# Patient Record
Sex: Male | Born: 1965 | Race: White | Hispanic: No | Marital: Married | Smoking: Never smoker
Health system: Southern US, Community
[De-identification: ages and names within clinical notes are randomized; demographics above are authoritative.]

## PROBLEM LIST (undated history)

## (undated) DIAGNOSIS — K219 Gastro-esophageal reflux disease without esophagitis: Secondary | ICD-10-CM

## (undated) DIAGNOSIS — R079 Chest pain, unspecified: Secondary | ICD-10-CM

## (undated) DIAGNOSIS — R030 Elevated blood-pressure reading, without diagnosis of hypertension: Secondary | ICD-10-CM

## (undated) DIAGNOSIS — Z8489 Family history of other specified conditions: Secondary | ICD-10-CM

---

## 2010-06-19 ENCOUNTER — Emergency Department (HOSPITAL_COMMUNITY)
Admission: EM | Admit: 2010-06-19 | Discharge: 2010-06-19 | Payer: Self-pay | Source: Home / Self Care | Admitting: Emergency Medicine

## 2010-08-30 LAB — POCT CARDIAC MARKERS
CKMB, poc: <1 ng/mL — ABNORMAL LOW (ref 1.0–8.0)
Troponin i, poc: <0.05 ng/mL (ref 0.00–0.09)
Troponin i, poc: <0.05 ng/mL (ref 0.00–0.09)

## 2010-08-30 LAB — CBC
HCT: 42 % (ref 39.0–52.0)
MCV: 92.3 fL (ref 78.0–100.0)
RDW: 12.5 % (ref 11.5–15.5)
WBC: 7.2 10*3/uL (ref 4.0–10.5)

## 2010-08-30 LAB — DIFFERENTIAL
Basophils Absolute: 0 10*3/uL (ref 0.0–0.1)
Eosinophils Relative: 2 % (ref 0–5)
Lymphocytes Relative: 33 % (ref 12–46)
Lymphs Abs: 2.4 10*3/uL (ref 0.7–4.0)
Monocytes Absolute: 0.5 10*3/uL (ref 0.1–1.0)

## 2010-08-30 LAB — BASIC METABOLIC PANEL
BUN: 13 mg/dL (ref 6–23)
Chloride: 105 mEq/L (ref 96–112)
Potassium: 3.4 mEq/L — ABNORMAL LOW (ref 3.5–5.1)

## 2011-04-23 ENCOUNTER — Other Ambulatory Visit: Payer: Self-pay

## 2011-04-23 ENCOUNTER — Observation Stay (HOSPITAL_COMMUNITY)
Admission: EM | Admit: 2011-04-23 | Discharge: 2011-04-24 | Disposition: A | Discharge status: Home / Self Care | Payer: 59 | Attending: Internal Medicine | Admitting: Internal Medicine

## 2011-04-23 ENCOUNTER — Emergency Department (HOSPITAL_COMMUNITY): Payer: 59

## 2011-04-23 DIAGNOSIS — R079 Chest pain, unspecified: Secondary | ICD-10-CM

## 2011-04-23 HISTORY — DX: Gastro-esophageal reflux disease without esophagitis: K21.9

## 2011-04-23 LAB — COMPREHENSIVE METABOLIC PANEL
Alkaline Phosphatase: 63 U/L (ref 39–117)
BUN: 18 mg/dL (ref 6–23)
Calcium: 9.5 mg/dL (ref 8.4–10.5)
GFR calc Af Amer: >90 mL/min (ref >90)
Glucose, Bld: 103 mg/dL — ABNORMAL HIGH (ref 70–99)
Total Protein: 7 g/dL (ref 6.0–8.3)

## 2011-04-23 LAB — CBC
HCT: 42 % (ref 39.0–52.0)
Hemoglobin: 14.9 g/dL (ref 13.0–17.0)
MCH: 32.3 pg (ref 26.0–34.0)
MCHC: 35.5 g/dL (ref 30.0–36.0)

## 2011-04-23 LAB — TROPONIN I: Troponin I: <0.3 ng/mL (ref <0.30)

## 2011-04-24 ENCOUNTER — Observation Stay (HOSPITAL_COMMUNITY): Payer: 59

## 2011-04-24 ENCOUNTER — Encounter (HOSPITAL_COMMUNITY): Payer: Self-pay | Admitting: *Deleted

## 2011-04-24 ENCOUNTER — Other Ambulatory Visit: Payer: Self-pay

## 2011-04-24 DIAGNOSIS — R079 Chest pain, unspecified: Secondary | ICD-10-CM

## 2011-04-24 HISTORY — DX: Chest pain, unspecified: R07.9

## 2011-04-24 LAB — CARDIAC PANEL(CRET KIN+CKTOT+MB+TROPI): Total CK: 173 U/L (ref 7–232)

## 2011-04-24 LAB — POCT I-STAT TROPONIN I

## 2011-04-24 MED ORDER — ASPIRIN EC 325 MG PO TBEC
325.0000 mg | DELAYED_RELEASE_TABLET | Freq: Every day | ORAL | Status: DC
Start: 2011-04-24 — End: 2011-04-24
  Administered 2011-04-24: 325 mg via ORAL
  Filled 2011-04-24: qty 1

## 2011-04-24 MED ORDER — NITROGLYCERIN 2 % TD OINT
2.0000 [in_us] | TOPICAL_OINTMENT | TRANSDERMAL | Status: DC
  Filled 2011-04-24: qty 30

## 2011-04-24 MED ORDER — ACETAMINOPHEN 325 MG PO TABS: 650.0000 mg | ORAL_TABLET | Freq: Four times a day (QID) | ORAL | Status: DC | PRN

## 2011-04-24 MED ORDER — TECHNETIUM TC 99M TETROFOSMIN IV KIT
10.0000 | PACK | Freq: Once | INTRAVENOUS | Status: AC | PRN
  Administered 2011-04-24: 10 via INTRAVENOUS

## 2011-04-24 MED ORDER — SODIUM CHLORIDE 0.9 % IV SOLN
Freq: Once | INTRAVENOUS | Status: AC
  Administered 2011-04-24: 10:00:00 via INTRAVENOUS

## 2011-04-24 MED ORDER — TECHNETIUM TC 99M TETROFOSMIN IV KIT
30.0000 | PACK | Freq: Once | INTRAVENOUS | Status: AC | PRN
  Administered 2011-04-24: 30 via INTRAVENOUS

## 2011-04-24 MED ORDER — NITROGLYCERIN 2 % TD OINT: 2.0000 [in_us] | TOPICAL_OINTMENT | TRANSDERMAL | Status: DC

## 2011-04-24 MED ORDER — SODIUM CHLORIDE 0.9 % IJ SOLN
3.0000 mL | Freq: Two times a day (BID) | INTRAMUSCULAR | Status: DC
  Administered 2011-04-24: 3 mL via INTRAVENOUS

## 2011-04-24 NOTE — H&P (Addendum)
Zachary Raymond, GRANDFIELD NO.:  [id number]  MEDICAL RECORD NO.:  [id number]  LOCATION:  1873                         FACILITY:  [facility]  PHYSICIAN:  Rollene Rotunda, MD, FACCDATE OF BIRTH:  1965-10-20  DATE OF ADMISSION:  04/23/2011 DATE OF DISCHARGE:                             HISTORY & PHYSICAL   PRIMARY CARE PHYSICIAN:  [name], MD  CARDIOLOGIST:  None.  REASON FOR CONSULT:  Evaluate the patient with chest pain.  HISTORY OF PRESENT ILLNESS:  The patient is a pleasant 45 year old gentleman without prior cardiac history.  He did have chest pain back in December.  However, this was thought perhaps to be related to anxiety. He was seen in the emergency room.  He had no further evaluation and has never had a stress test or other cardiac workup.  He really is quite healthy and has not had cardiovascular risk factors.  His blood pressure was borderline recently.  Yesterday he had some left arm discomfort. This happened at rest.  It happened off and on.  Today after blowing some leads, he did have some mild substernal chest discomfort lasting for about 20 minutes.  However, at 8:00 p.m., again at rest, he had midsternal chest discomfort.  He had eaten a grilled chicken salad.  He did not have any discomfort in his jaw or his arms.  It was moderate in intensity.  There was no associated nausea, vomiting, or diaphoresis. He has not had any palpitations, presyncope, or syncope.  He has had no shortness of breath, PND, or orthopnea.  He came to the emergency room where he was not found to have any acute EKG changes.  Cardiac markers initially have been negative.  He did get some sublingual nitroglycerin. His pain eased over about 20 minutes.  However, he has had some mild aching continued in his upper chest.  PAST MEDICAL HISTORY:  None.  PAST SURGICAL HISTORY:  None.  ALLERGIES:  Penicillin.  MEDICATIONS:  None.  SOCIAL HISTORY:  The patient is married.   He runs a [employer].  He has never smoked cigarettes.  He has had quite a bit of stress with illness in his family including a son in high school who is newly diagnosed with diabetes.  FAMILY HISTORY:  Noncontributory for first-degree relatives with early coronary artery disease though his maternal grandfather in his 17s had bypass.  REVIEW OF SYSTEMS:  As stated in the HPI.  Negative for all other systems.  PHYSICAL EXAMINATION:  GENERAL:  The patient is in no distress. VITAL SIGNS:  Blood pressure 131/83, heart rate 58 and regular, afebrile, respiratory rate 14, saturation 98% on room air. HEENT:  Eyelids unremarkable.  Pupils equal, round, react to light. Fundi not visualized, oral mucosa unremarkable. NECK:  No jugular venous distention at 45 degrees.  Carotid upstroke brisk and symmetrical.  No bruits.  No thyromegaly. LYMPHATICS;  No cervical, axillary, or inguinal adenopathy. LUNGS:  Clear to auscultation bilaterally. BACK:  No costovertebral angle tenderness. CHEST:  Unremarkable. HEART:  PMI not displaced or sustained, S1 and S2 within normal limits, no S3, no S4.  No clicks, rubs, no murmurs. ABDOMEN:  Flat, positive  bowel sounds.  Normal in frequency and pitch. No bruits.  No rebound, guarding.  No midline pulsatile mass, no hepatomegaly, no splenomegaly. SKIN:  No rashes, no nodules. EXTREMITIES:  Pulses 2+ throughout, no edema, no cyanosis, no clubbing. NEUROLOGIC:  Oriented to person, place, and time.  Cranial nerves II-XII grossly intact, motor grossly intact.  EKG, sinus bradycardia, rate 61, axis within normal limits, intervals within normal limits, RSR prime V1, V2, no acute ST-T wave changes.  LABORATORY DATA:  Sodium 140, potassium 3.5, BUN 18, creatinine 1.1, WBC 7.3, hemoglobin 14.9, platelets 217, troponin less than 0.3.  Chest x-ray no acute disease.  ASSESSMENT AND PLAN:  Chest discomfort.  The patient's chest discomfort has some worrisome  features.  He does not have significant cardiovascular risk factors however.  At this point, I would screen him with stress perfusion imaging given the recurrent and persistent nature of the chest discomfort and its description.  Further imaging will be based on these results.     Rollene Rotunda, MD, [facility]     JH/MEDQ  D:  04/24/2011  T:  04/24/2011  Job:  [id number]  Electronically Signed by Rollene Rotunda MD [facility] on 04/27/2011 05:08:11 PM

## 2011-04-24 NOTE — Progress Notes (Signed)
Subjective: No recurrent chest pain. Felt better after GI cocktail. NO dyspnea or palpitations.   Objective: Temp:  [97.7 F (36.5 C)] 97.7 F (36.5 C) (11/04 0344) Pulse Rate:  [53] 53  (11/04 0344) Resp:  [20] 20  (11/04 0344) BP: (108)/(78) 108/78 mmHg (11/04 0344) SpO2:  [98 %] 98 % (11/04 0344) Weight:  [179 lb 3.2 oz (81.285 kg)] 179 lb 3.2 oz (81.285 kg) (11/04 0344)  Telemetry - Sinus rhythm  Exam -   General - WNWD, NAD  Lungs - Clear, nonlabored  Cardiac - RRR, no significant murmur or gallop  Abdomen - NABS  Extremities - No edema  Testing -  Lab Results  Component Value Date   CREATININE 1.10 04/23/2011   BUN 18 04/23/2011   NA 140 04/23/2011   K 3.5 04/23/2011   CL 102 04/23/2011   CO2 27 04/23/2011   Lab Results  Component Value Date   WBC 7.3 04/23/2011   HGB 14.9 04/23/2011   HCT 42.0 04/23/2011   MCV 90.9 04/23/2011   PLT 217 04/23/2011   Lab Results  Component Value Date   CKTOTAL 173 04/24/2011   CKMB 3.2 04/24/2011   TROPONINI <0.30 04/24/2011   ECG - SInus bradycardia with LAE, small R primes V1-V2.  CXR -  IMPRESSION: No focal consolidation or acute process identified.  Current Medications -    . aspirin EC  325 mg Oral Daily  . nitroGLYCERIN  2 inch Topical Custom  . sodium chloride  3 mL Intravenous Q12H                Assessment:  1. Chest pain - resolved. ECG nonspecific, CXR not acute, and cardiac markers normal at this point. No significant arrhythmias. Pain improved after GI cocktail and was postprandial in nature. Dr. Antoine Poche recommended inpatient exercise Myoview which is ordered for today.  2. Possible GERD. Denies dysphagia.  Plan:  NPO for exercise Myoview today. Discussed with nursing. D/C nitropaste for testing. Our service to followup on results later today with possible patient discharge. If outpatient followup needed, can see Dr. Antoine Poche in the office.

## 2011-04-24 NOTE — Progress Notes (Signed)
Pt and wife provided with discharge teaching. Pt and wife verbalized understanding and had no questions regarding follow up care. Pt declined wheelchair, and walked out of unit.

## 2011-04-24 NOTE — Discharge Summary (Signed)
  Patient ID: Zachary Raymond,  MRN: [id number], DOB/AGE: Apr 29, 1966 46 y.o.  Admit date: 04/23/2011 Discharge date: 04/24/2011  Discharge Diagnoses Active Problems:  Chest pain   Allergies Allergies  Allergen Reactions  . Penicillins Rash    Procedures  Exercise Myoview IMPRESSION: No evidence of ischemia or infarction.  Ejection fraction 58%   History of Present Illness  45 y/o male w/o prior cardiac history that presented to the [facility] ED on 11/3 w/ complaints of chest pain and indigestion.  CE were neg in ED and ecg was nonacute.  Pt was admitted for further eval.  Hospital Course   Pt. R/o for MI and had no further c/p.  He underwent an exercise MV, exercising for w/o c/p or ecg Ss and perfusion imaging showed no evidence of ischemia.  He was d/c today in good condition.  Discharge Vitals:  Blood pressure 147/71, pulse 89, temperature 97.7 F (36.5 C), resp. rate 20, weight 179 lb 3.2 oz (81.285 kg), SpO2 98.00%.   Labs:   Lab Results  Component Value Date   WBC 7.3 04/23/2011   HGB 14.9 04/23/2011   HCT 42.0 04/23/2011   MCV 90.9 04/23/2011   PLT 217 04/23/2011     Lab 04/23/11 2111  NA 140  K 3.5  CL 102  CO2 27  BUN 18  CREATININE 1.10  CALCIUM 9.5  PROT 7.0  BILITOT 0.7  ALKPHOS 63  ALT 19  AST 25  GLUCOSE 103*   Lab Results  Component Value Date   CKTOTAL 173 04/24/2011   CKMB 3.2 04/24/2011   TROPONINI <0.30 04/24/2011     Disposition:  Discharge Orders    Future Orders Please Complete By Expires   Discontinue IV        Follow-up Information    Follow up with Rollene Rotunda, MD. (As needed)    Contact information:   [address], [address] [place] [address] [phone]          Discharge Medications: There are no discharge medications for this patient.   Outstanding Labs/Studies NONE  Duration of Discharge Encounter: Greater than 30 minutes including physician  time.  Hettie Holstein 04/24/2011, 3:46 PM

## 2016-06-20 HISTORY — PX: COLONOSCOPY: SHX174

## 2016-06-30 DIAGNOSIS — Z1211 Encounter for screening for malignant neoplasm of colon: Secondary | ICD-10-CM | POA: Diagnosis not present

## 2016-06-30 DIAGNOSIS — K5730 Diverticulosis of large intestine without perforation or abscess without bleeding: Secondary | ICD-10-CM | POA: Diagnosis not present

## 2016-08-02 DIAGNOSIS — Z23 Encounter for immunization: Secondary | ICD-10-CM | POA: Diagnosis not present

## 2016-09-22 DIAGNOSIS — I2510 Atherosclerotic heart disease of native coronary artery without angina pectoris: Secondary | ICD-10-CM | POA: Insufficient documentation

## 2016-11-10 ENCOUNTER — Other Ambulatory Visit (HOSPITAL_COMMUNITY): Payer: 59

## 2016-11-10 ENCOUNTER — Observation Stay (HOSPITAL_COMMUNITY)
Admission: EM | Admit: 2016-11-10 | Discharge: 2016-11-12 | Disposition: A | Discharge status: Home / Self Care | Payer: 59 | Attending: Family Medicine | Admitting: Family Medicine

## 2016-11-10 ENCOUNTER — Encounter (HOSPITAL_COMMUNITY): Payer: Self-pay

## 2016-11-10 ENCOUNTER — Emergency Department (HOSPITAL_COMMUNITY): Payer: 59

## 2016-11-10 DIAGNOSIS — Z8249 Family history of ischemic heart disease and other diseases of the circulatory system: Secondary | ICD-10-CM | POA: Diagnosis not present

## 2016-11-10 DIAGNOSIS — R079 Chest pain, unspecified: Secondary | ICD-10-CM | POA: Diagnosis not present

## 2016-11-10 DIAGNOSIS — I712 Thoracic aortic aneurysm, without rupture: Secondary | ICD-10-CM | POA: Insufficient documentation

## 2016-11-10 DIAGNOSIS — I10 Essential (primary) hypertension: Secondary | ICD-10-CM | POA: Diagnosis not present

## 2016-11-10 DIAGNOSIS — K219 Gastro-esophageal reflux disease without esophagitis: Secondary | ICD-10-CM | POA: Diagnosis not present

## 2016-11-10 DIAGNOSIS — Z79899 Other long term (current) drug therapy: Secondary | ICD-10-CM | POA: Diagnosis not present

## 2016-11-10 DIAGNOSIS — R001 Bradycardia, unspecified: Secondary | ICD-10-CM | POA: Diagnosis not present

## 2016-11-10 DIAGNOSIS — Z880 Allergy status to penicillin: Secondary | ICD-10-CM | POA: Insufficient documentation

## 2016-11-10 DIAGNOSIS — R51 Headache: Secondary | ICD-10-CM | POA: Insufficient documentation

## 2016-11-10 DIAGNOSIS — Z7982 Long term (current) use of aspirin: Secondary | ICD-10-CM | POA: Insufficient documentation

## 2016-11-10 DIAGNOSIS — R0789 Other chest pain: Secondary | ICD-10-CM | POA: Diagnosis not present

## 2016-11-10 DIAGNOSIS — R519 Headache, unspecified: Secondary | ICD-10-CM

## 2016-11-10 DIAGNOSIS — I7789 Other specified disorders of arteries and arterioles: Secondary | ICD-10-CM

## 2016-11-10 HISTORY — DX: Family history of other specified conditions: Z84.89

## 2016-11-10 HISTORY — DX: Elevated blood-pressure reading, without diagnosis of hypertension: R03.0

## 2016-11-10 HISTORY — DX: Chest pain, unspecified: R07.9

## 2016-11-10 LAB — BASIC METABOLIC PANEL
Anion gap: 7 (ref 5–15)
BUN: 19 mg/dL (ref 6–20)
CALCIUM: 9.2 mg/dL (ref 8.9–10.3)
CO2: 27 mmol/L (ref 22–32)
CREATININE: 1.01 mg/dL (ref 0.61–1.24)
Chloride: 103 mmol/L (ref 101–111)
GFR calc Af Amer: >60 mL/min (ref >60)
Glucose, Bld: 94 mg/dL (ref 65–99)
POTASSIUM: 4.1 mmol/L (ref 3.5–5.1)
SODIUM: 137 mmol/L (ref 135–145)

## 2016-11-10 LAB — URINALYSIS, COMPLETE (UACMP) WITH MICROSCOPIC
BILIRUBIN URINE: NEGATIVE
Bacteria, UA: NONE SEEN
GLUCOSE, UA: NEGATIVE mg/dL
HGB URINE DIPSTICK: NEGATIVE
Ketones, ur: NEGATIVE mg/dL
Leukocytes, UA: NEGATIVE
NITRITE: NEGATIVE
PROTEIN: NEGATIVE mg/dL
SPECIFIC GRAVITY, URINE: 1.015 (ref 1.005–1.030)
Squamous Epithelial / LPF: NONE SEEN
pH: 5 (ref 5.0–8.0)

## 2016-11-10 LAB — I-STAT TROPONIN, ED: TROPONIN I, POC: 0.01 ng/mL (ref 0.00–0.08)

## 2016-11-10 LAB — CBC
HCT: 45.7 % (ref 39.0–52.0)
Hemoglobin: 15.6 g/dL (ref 13.0–17.0)
MCH: 31.5 pg (ref 26.0–34.0)
MCHC: 34.1 g/dL (ref 30.0–36.0)
MCV: 92.1 fL (ref 78.0–100.0)
Platelets: 216 10*3/uL (ref 150–400)
RBC: 4.96 MIL/uL (ref 4.22–5.81)
RDW: 12.5 % (ref 11.5–15.5)
WBC: 5.7 10*3/uL (ref 4.0–10.5)

## 2016-11-10 LAB — TROPONIN I
Troponin I: <0.03 ng/mL (ref <0.03)
Troponin I: <0.03 ng/mL (ref <0.03)

## 2016-11-10 MED ORDER — ONDANSETRON HCL 4 MG PO TABS: 4.0000 mg | ORAL_TABLET | Freq: Four times a day (QID) | ORAL | Status: DC | PRN

## 2016-11-10 MED ORDER — ASPIRIN 81 MG PO CHEW
324.0000 mg | CHEWABLE_TABLET | Freq: Once | ORAL | Status: AC
  Administered 2016-11-10: 324 mg via ORAL
  Filled 2016-11-10: qty 4

## 2016-11-10 MED ORDER — SODIUM CHLORIDE 0.9 % IV SOLN
INTRAVENOUS | Status: DC
  Administered 2016-11-10 – 2016-11-12 (×3): via INTRAVENOUS

## 2016-11-10 MED ORDER — GI COCKTAIL ~~LOC~~: 30.0000 mL | Freq: Four times a day (QID) | ORAL | Status: DC | PRN

## 2016-11-10 MED ORDER — BISACODYL 10 MG RE SUPP: 10.0000 mg | Freq: Every day | RECTAL | Status: DC | PRN

## 2016-11-10 MED ORDER — KCL IN DEXTROSE-NACL 20-5-0.45 MEQ/L-%-% IV SOLN
INTRAVENOUS | Status: AC
  Administered 2016-11-11: 05:00:00 via INTRAVENOUS
  Filled 2016-11-10: qty 1000

## 2016-11-10 MED ORDER — HYDROCODONE-ACETAMINOPHEN 5-325 MG PO TABS: 1.0000 | ORAL_TABLET | ORAL | Status: DC | PRN

## 2016-11-10 MED ORDER — ASPIRIN EC 81 MG PO TBEC: 81.0000 mg | DELAYED_RELEASE_TABLET | Freq: Every day | ORAL | Status: DC

## 2016-11-10 MED ORDER — MORPHINE SULFATE (PF) 4 MG/ML IV SOLN: 2.0000 mg | INTRAVENOUS | Status: DC | PRN

## 2016-11-10 MED ORDER — KETOROLAC TROMETHAMINE 15 MG/ML IJ SOLN: 15.0000 mg | Freq: Four times a day (QID) | INTRAMUSCULAR | Status: DC | PRN

## 2016-11-10 MED ORDER — SODIUM CHLORIDE 0.9% FLUSH
3.0000 mL | Freq: Two times a day (BID) | INTRAVENOUS | Status: DC
  Administered 2016-11-10 – 2016-11-11 (×2): 3 mL via INTRAVENOUS

## 2016-11-10 MED ORDER — ACETAMINOPHEN 650 MG RE SUPP: 650.0000 mg | Freq: Four times a day (QID) | RECTAL | Status: DC | PRN

## 2016-11-10 MED ORDER — ASPIRIN EC 325 MG PO TBEC
325.0000 mg | DELAYED_RELEASE_TABLET | Freq: Every day | ORAL | Status: DC
  Administered 2016-11-11: 325 mg via ORAL
  Filled 2016-11-10: qty 1

## 2016-11-10 MED ORDER — HYDRALAZINE HCL 20 MG/ML IJ SOLN: 5.0000 mg | Freq: Three times a day (TID) | INTRAMUSCULAR | Status: DC | PRN

## 2016-11-10 MED ORDER — ENOXAPARIN SODIUM 40 MG/0.4ML ~~LOC~~ SOLN: 40.0000 mg | SUBCUTANEOUS | Status: DC

## 2016-11-10 MED ORDER — ONDANSETRON HCL 4 MG/2ML IJ SOLN: 4.0000 mg | Freq: Four times a day (QID) | INTRAMUSCULAR | Status: DC | PRN

## 2016-11-10 MED ORDER — ACETAMINOPHEN 325 MG PO TABS
650.0000 mg | ORAL_TABLET | Freq: Four times a day (QID) | ORAL | Status: DC | PRN
  Administered 2016-11-12: 650 mg via ORAL
  Filled 2016-11-10: qty 2

## 2016-11-10 MED ORDER — SENNOSIDES-DOCUSATE SODIUM 8.6-50 MG PO TABS: 1.0000 | ORAL_TABLET | Freq: Every evening | ORAL | Status: DC | PRN

## 2016-11-10 MED ORDER — NITROGLYCERIN 0.4 MG SL SUBL: 0.4000 mg | SUBLINGUAL_TABLET | SUBLINGUAL | Status: DC | PRN

## 2016-11-10 MED ORDER — ACETAMINOPHEN 500 MG PO TABS
1000.0000 mg | ORAL_TABLET | Freq: Once | ORAL | Status: AC
  Administered 2016-11-10: 1000 mg via ORAL
  Filled 2016-11-10: qty 2

## 2016-11-10 NOTE — H&P (Signed)
History and Physical    Zachary Raymond ZOX:[id number] DOB: 1966/04/24 DOA: 11/10/2016   PCP: Johny Blamer, MD   Patient coming from:  Home    Chief Complaint: Chest pain   HPI: Zachary Raymond is a 51 y.o. male with medical history significant for GERD,  presenting with substernal chest pain  Without radiation, while driving this morning, accompanied by some tingling in all extremities lasting 2-3 minutes, without nausea, vomiting or diaphoresis. Pain notworsened with deep inspiration, movement or exertion. Till presentation to the ED, he had only one episode, but this is similar to that of 2012, for which he sought medical attention. Back in 2012, he had a  Cardiologist evaluation, and a myoview test was performed, normal, with EF 68 %. Patient took ASA 324 mg on arrival, No  NTG  As his CP subsided. Denies any dizziness or falls. No syncope or presyncope. No SOB or cough. Denies any fever or chills or recent infections. Appetite is normal and eats salt rich foods. Denies any leg swelling or calf pain. He reports dull frontal headaches since this morning headaches without vision changes. Denies any seizures No confusion reported. Last long distance trip 1 week ago to the beach. He reports stressors in his personal life, did not elaborate. No new meds. He takes some caffeine drinks prior to excercising. Not on hormonal therapy. No new herbal supplements. No tobacco  ETOH or recreational drugs. No recreational drugs. + fam hx heart disease on grandfather who had an MI, otherwise negative  ED Course:  BP (!) 158/104   Pulse (!) 55   Temp 97.4 F (36.3 C) (Oral)   Resp 18   Ht 5\' 9"  (1.753 m)   Wt 79.4 kg (175 lb)   SpO2 96%   BMI 25.84 kg/m    Received ASA x1, currently no CP EKG SR with TWI. CXR neg Tn negative CBC and CMET unremarkable   Review of Systems:  As per HPI otherwise all other systems reviewed and are negative  Past Medical History:  Diagnosis Date  . GERD  (gastroesophageal reflux disease)     History reviewed. No pertinent surgical history.  Social History Social History   Social History  . Marital status: Married    Spouse name: N/A  . Number of children: N/A  . Years of education: N/A   Occupational History  . Not on file.   Social History Main Topics  . Smoking status: Never Smoker  . Smokeless tobacco: Never Used  . Alcohol use Yes     Comment: Social drinker  . Drug use: No  . Sexual activity: Yes   Other Topics Concern  . Not on file   Social History Narrative  . No narrative on file     Allergies  Allergen Reactions  . Penicillins Rash    Has patient had a PCN reaction causing immediate rash, facial/tongue/throat swelling, SOB or lightheadedness with hypotension: No Has patient had a PCN reaction causing severe rash involving mucus membranes or skin necrosis: No Has patient had a PCN reaction that required hospitalization: No Has patient had a PCN reaction occurring within the last 10 years: No If all of the above answers are "NO", then may proceed with Cephalosporin use.    Family History  Problem Relation Age of Onset  . Coronary artery disease Maternal Grandfather        CABG in 60's      Prior to Admission medications   Not on File  Physical Exam:  Vitals:   11/10/16 0915 11/10/16 1000 11/10/16 1011 11/10/16 1015  BP: (!) 145/103 (!) 152/100 (!) 162/96 (!) 158/104  Pulse: 61 64  (!) 55  Resp: 12 18  18   Temp:      TempSrc:      SpO2: 94% 98%  96%  Weight:      Height:       Constitutional: NAD, calm, comfortable  Eyes: PERRL, lids and conjunctivae normal ENMT: Mucous membranes are moist, without exudate or lesions  Neck: normal, supple, no masses, no thyromegaly Respiratory: clear to auscultation bilaterally, no wheezing, no crackles. Normal respiratory effort  Cardiovascular: Regular rate and rhythm, no murmurs, rubs or gallops. No extremity edema. 2+ pedal pulses. No carotid  bruits.  Abdomen: Soft, non tender, No hepatosplenomegaly. Bowel sounds positive.  Musculoskeletal: no clubbing / cyanosis. Moves all extremities. NO costal or sternal tenderness to palpation  Skin: no jaundice, No lesions.  Neurologic: Sensation intact  Strength equal in all extremities Psychiatric:   Alert and oriented x 3. Normal mood.     Labs on Admission: I have personally reviewed following labs and imaging studies  CBC:  Recent Labs Lab 11/10/16 0831  WBC 5.7  HGB 15.6  HCT 45.7  MCV 92.1  PLT 216    Basic Metabolic Panel:  Recent Labs Lab 11/10/16 0831  NA 137  K 4.1  CL 103  CO2 27  GLUCOSE 94  BUN 19  CREATININE 1.01  CALCIUM 9.2    GFR: Estimated Creatinine Clearance: 85.6 mL/min (by C-G formula based on SCr of 1.01 mg/dL).  Liver Function Tests: No results for input(s): AST, ALT, ALKPHOS, BILITOT, PROT, ALBUMIN in the last 168 hours. No results for input(s): LIPASE, AMYLASE in the last 168 hours. No results for input(s): AMMONIA in the last 168 hours.  Coagulation Profile: No results for input(s): INR, PROTIME in the last 168 hours.  Cardiac Enzymes: No results for input(s): CKTOTAL, CKMB, CKMBINDEX, TROPONINI in the last 168 hours.  BNP (last 3 results) No results for input(s): PROBNP in the last 8760 hours.  HbA1C: No results for input(s): HGBA1C in the last 72 hours.  CBG: No results for input(s): GLUCAP in the last 168 hours.  Lipid Profile: No results for input(s): CHOL, HDL, LDLCALC, TRIG, CHOLHDL, LDLDIRECT in the last 72 hours.  Thyroid Function Tests: No results for input(s): TSH, T4TOTAL, FREET4, T3FREE, THYROIDAB in the last 72 hours.  Anemia Panel: No results for input(s): VITAMINB12, FOLATE, FERRITIN, TIBC, IRON, RETICCTPCT in the last 72 hours.  Urine analysis: No results found for: COLORURINE, APPEARANCEUR, LABSPEC, PHURINE, GLUCOSEU, HGBUR, BILIRUBINUR, KETONESUR, PROTEINUR, UROBILINOGEN, NITRITE,  LEUKOCYTESUR  Sepsis Labs: @LABRCNTIP (procalcitonin:4,lacticidven:4) )No results found for this or any previous visit (from the past 240 hour(s)).   Radiological Exams on Admission: Dg Chest 2 View  Result Date: 11/10/2016 CLINICAL DATA:  Chest pain EXAM: CHEST  2 VIEW COMPARISON:  04/23/2011 FINDINGS: Heart and mediastinal contours are within normal limits. No focal opacities or effusions. No acute bony abnormality. IMPRESSION: No active cardiopulmonary disease. Electronically Signed   By: Charlett Nose M.D.   On: 11/10/2016 09:02    EKG: Independently reviewed.  Assessment/Plan Active Problems:   Chest pain   Hypertension   New onset of headaches    Chest pain syndrome, cardiac versus musculoskeletal vs anxiety HEART score 3-4 . Troponin neg , EKG with TWI. Marland Kitchen CP relieved by  aspirin. CXR unrevealing.  Last stress test in 2012 was negative  Risk factors include HTN, fam hx for heart disease and social stressors  Admit to Telemetry/ Observation Chest pain order set Cycle troponins EKG in am and with chest pain  continue ASA, O2 and NTG as needed GI cocktail Check Lipid panel  Hb A1C Minimize caffeinated drinks  May need a stress test as Outpatient    Hypertension BP  162/96   Pulse 61   On admission this BP was 150s/100  Consider d/c patient in a anti-hypertensive medications such as ACEI  Add Hydralazine Q6 hours as needed for BP 160/90  Minimize caffeine   Headaches, likely due to hypertension . No seizures on AMS.   Tylenol for Headaches antihypertensive as above   GERD, in no meds as OP     DVT prophylaxis: Lovenox  Code Status:   Full      Family Communication:  Discussed with patient Disposition Plan: Expect patient to be discharged to home after condition improves Consults called:    none Admission status:Tele  Obs    Kasyn Rolph E, PA-C [facility]   11/10/2016, 10:27 AM

## 2016-11-10 NOTE — ED Triage Notes (Signed)
Per Pt, Pt is coming from home with complaints of Chest pain and headache that started on his way to work this morning. Pt reports tingling sensation in his right and left arm that then proceeded to legs. Pt denies N/V, SOB. Reports some lightheadedness.

## 2016-11-10 NOTE — ED Notes (Signed)
Patient not currently in room

## 2016-11-10 NOTE — ED Provider Notes (Signed)
[facility]-EMERGENCY DEPT Provider Note   CSN: [id number] Arrival date & time: 11/10/16  0818     History   Chief Complaint Chief Complaint  Patient presents with  . Chest Pain    HPI Zachary Raymond is a 51 y.o. male.  Patient is a 51 year old male with a history of gastroesophageal reflux disease who presents with chest pain. He states he was driving to work this morning and noticed a headache. Following that he noticed some tightness across his chest. He had some tingling in both of his arms and a little bit in his leg. He states he felt real flushed and got a little lightheaded. This lasted about 30-45 minutes. He states it's essentially resolved at this point. He still has a bifrontal-type headache. He does state that his blood pressures been elevated over the last year with his blood pressure averaging about 150/90. He has not followed up with his PCP regarding this. He is not currently on antihypertensive medication. He denies any family history of heart disease. He's a nonsmoker.      Past Medical History:  Diagnosis Date  . GERD (gastroesophageal reflux disease)     Patient Active Problem List   Diagnosis Date Noted  . Chest pain 04/24/2011    History reviewed. No pertinent surgical history.     Home Medications    Prior to Admission medications   Not on File    Family History Family History  Problem Relation Age of Onset  . Coronary artery disease Maternal Grandfather        CABG in 80's    Social History Social History  Substance Use Topics  . Smoking status: Never Smoker  . Smokeless tobacco: Never Used  . Alcohol use Yes     Comment: Social drinker     Allergies   Penicillins   Review of Systems Review of Systems  Constitutional: Negative for chills, diaphoresis, fatigue and fever.  HENT: Negative for congestion, rhinorrhea and sneezing.   Eyes: Negative.   Respiratory: Positive for chest tightness. Negative for cough and shortness of  breath.   Cardiovascular: Negative for chest pain and leg swelling.  Gastrointestinal: Negative for abdominal pain, blood in stool, diarrhea, nausea and vomiting.  Genitourinary: Negative for difficulty urinating, flank pain, frequency and hematuria.  Musculoskeletal: Negative for arthralgias and back pain.  Skin: Negative for rash.  Neurological: Positive for numbness and headaches. Negative for dizziness, speech difficulty and weakness.     Physical Exam Updated Vital Signs BP (!) 145/103   Pulse 61   Temp 97.4 F (36.3 C) (Oral)   Resp 12   Ht 5\' 9"  (1.753 m)   Wt 79.4 kg (175 lb)   SpO2 94%   BMI 25.84 kg/m   Physical Exam  Constitutional: He is oriented to person, place, and time. He appears well-developed and well-nourished.  HENT:  Head: Normocephalic and atraumatic.  Eyes: Pupils are equal, round, and reactive to light.  Neck: Normal range of motion. Neck supple.  Cardiovascular: Normal rate, regular rhythm and normal heart sounds.   Pulmonary/Chest: Effort normal and breath sounds normal. No respiratory distress. He has no wheezes. He has no rales. He exhibits no tenderness.  Abdominal: Soft. Bowel sounds are normal. There is no tenderness. There is no rebound and no guarding.  Musculoskeletal: Normal range of motion. He exhibits no edema.  No edema or calf tenderness  Lymphadenopathy:    He has no cervical adenopathy.  Neurological: He is alert and oriented  to person, place, and time.  Skin: Skin is warm and dry. No rash noted.  Psychiatric: He has a normal mood and affect.     ED Treatments / Results  Labs (all labs ordered are listed, but only abnormal results are displayed) Labs Reviewed  BASIC METABOLIC PANEL  CBC  I-STAT TROPOININ, ED    EKG  EKG Interpretation  Date/Time:  Thursday Nov 10 2016 08:23:22 EDT Ventricular Rate:  61 PR Interval:  160 QRS Duration: 96 QT Interval:  446 QTC Calculation: 448 R Axis:   79 Text Interpretation:   Normal sinus rhythm ST & T wave abnormality, consider inferior ischemia Abnormal ECG changed from prior EKG Confirmed by Rolan BuccoBelfi, Nealy Karapetian [phone] on 11/10/2016 8:32:26 AM       Radiology Dg Chest 2 View  Result Date: 11/10/2016 CLINICAL DATA:  Chest pain EXAM: CHEST  2 VIEW COMPARISON:  04/23/2011 FINDINGS: Heart and mediastinal contours are within normal limits. No focal opacities or effusions. No acute bony abnormality. IMPRESSION: No active cardiopulmonary disease. Electronically Signed   By: Charlett NoseKevin  Dover M.D.   On: 11/10/2016 09:02    Procedures Procedures (including critical care time)  Medications Ordered in ED Medications  aspirin chewable tablet 324 mg (not administered)  acetaminophen (TYLENOL) tablet 1,000 mg (not administered)     Initial Impression / Assessment and Plan / ED Course  I have reviewed the triage vital signs and the nursing notes.  Pertinent labs & imaging results that were available during my care of the patient were reviewed by me and considered in my medical decision making (see chart for details).     Patient presents with chest pain. His only risk factor is his blood pressure which is been elevated. His EKG does show some changes as compared to his prior EKG with T-wave inversion and flattening inferiorly. His troponin is negative. Is pain-free now. He was given aspirin in the ED. His chest x-ray is clear. There is no other suggestions that would be more concerning for pulmonary embolus. No evidence of pneumonia. Given his EKG changes, I felt that he needed to be admitted for further cardiac evaluation. I spoke with the APP with the hospitalist service to admit the patient. [facility]  Final Clinical Impressions(s) / ED Diagnoses   Final diagnoses:  Chest pain, unspecified type    New Prescriptions New Prescriptions   No medications on file     Rolan BuccoBelfi, Shawntae Lowy, MD 11/10/16 [phone]

## 2016-11-11 ENCOUNTER — Observation Stay (HOSPITAL_BASED_OUTPATIENT_CLINIC_OR_DEPARTMENT_OTHER): Payer: 59

## 2016-11-11 ENCOUNTER — Ambulatory Visit (HOSPITAL_COMMUNITY)
Admission: EM | Disposition: A | Discharge status: Home / Self Care | Payer: Self-pay | Source: Home / Self Care | Attending: Emergency Medicine

## 2016-11-11 ENCOUNTER — Encounter (HOSPITAL_COMMUNITY): Payer: Self-pay | Admitting: Physician Assistant

## 2016-11-11 DIAGNOSIS — K219 Gastro-esophageal reflux disease without esophagitis: Secondary | ICD-10-CM | POA: Diagnosis not present

## 2016-11-11 DIAGNOSIS — I10 Essential (primary) hypertension: Secondary | ICD-10-CM | POA: Diagnosis not present

## 2016-11-11 DIAGNOSIS — R079 Chest pain, unspecified: Secondary | ICD-10-CM | POA: Diagnosis not present

## 2016-11-11 DIAGNOSIS — R072 Precordial pain: Secondary | ICD-10-CM

## 2016-11-11 DIAGNOSIS — R51 Headache: Secondary | ICD-10-CM | POA: Diagnosis not present

## 2016-11-11 DIAGNOSIS — R001 Bradycardia, unspecified: Secondary | ICD-10-CM | POA: Diagnosis not present

## 2016-11-11 HISTORY — PX: LEFT HEART CATH AND CORONARY ANGIOGRAPHY: CATH118249

## 2016-11-11 LAB — LIPID PANEL
Cholesterol: 170 mg/dL (ref 0–200)
HDL: 58 mg/dL (ref >40)
LDL Cholesterol: 100 mg/dL — ABNORMAL HIGH (ref 0–99)
TRIGLYCERIDES: 59 mg/dL (ref <150)
Total CHOL/HDL Ratio: 2.9 RATIO
VLDL: 12 mg/dL (ref 0–40)

## 2016-11-11 LAB — COMPREHENSIVE METABOLIC PANEL
ALT: 19 U/L (ref 17–63)
AST: 20 U/L (ref 15–41)
Albumin: 3.6 g/dL (ref 3.5–5.0)
Alkaline Phosphatase: 48 U/L (ref 38–126)
Anion gap: 7 (ref 5–15)
BILIRUBIN TOTAL: 0.6 mg/dL (ref 0.3–1.2)
BUN: 11 mg/dL (ref 6–20)
CHLORIDE: 106 mmol/L (ref 101–111)
CO2: 28 mmol/L (ref 22–32)
Calcium: 8.7 mg/dL — ABNORMAL LOW (ref 8.9–10.3)
Creatinine, Ser: 0.98 mg/dL (ref 0.61–1.24)
Glucose, Bld: 102 mg/dL — ABNORMAL HIGH (ref 65–99)
Potassium: 3.6 mmol/L (ref 3.5–5.1)
Sodium: 141 mmol/L (ref 135–145)
Total Protein: 5.9 g/dL — ABNORMAL LOW (ref 6.5–8.1)

## 2016-11-11 LAB — CBC
HEMATOCRIT: 43.6 % (ref 39.0–52.0)
Hemoglobin: 14.6 g/dL (ref 13.0–17.0)
MCH: 30.9 pg (ref 26.0–34.0)
MCHC: 33.5 g/dL (ref 30.0–36.0)
MCV: 92.2 fL (ref 78.0–100.0)
PLATELETS: 213 10*3/uL (ref 150–400)
RBC: 4.73 MIL/uL (ref 4.22–5.81)
RDW: 12.5 % (ref 11.5–15.5)
WBC: 5.7 10*3/uL (ref 4.0–10.5)

## 2016-11-11 LAB — ECHOCARDIOGRAM COMPLETE
Height: 69 in
Weight: 2800 oz

## 2016-11-11 LAB — HIV ANTIBODY (ROUTINE TESTING W REFLEX): HIV SCREEN 4TH GENERATION: NONREACTIVE

## 2016-11-11 LAB — HEMOGLOBIN A1C
Hgb A1c MFr Bld: 5.5 % (ref 4.8–5.6)
Mean Plasma Glucose: 111 mg/dL

## 2016-11-11 LAB — PROTIME-INR
INR: 1.01
Prothrombin Time: 13.3 seconds (ref 11.4–15.2)

## 2016-11-11 SURGERY — LEFT HEART CATH AND CORONARY ANGIOGRAPHY
Anesthesia: LOCAL

## 2016-11-11 MED ORDER — NITROGLYCERIN 1 MG/10 ML FOR IR/CATH LAB
INTRA_ARTERIAL | Status: AC
  Filled 2016-11-11: qty 10

## 2016-11-11 MED ORDER — ATORVASTATIN CALCIUM 20 MG PO TABS: 20.0000 mg | ORAL_TABLET | Freq: Every day | ORAL | Status: DC

## 2016-11-11 MED ORDER — SODIUM CHLORIDE 0.9 % IV SOLN: 250.0000 mL | INTRAVENOUS | Status: DC | PRN

## 2016-11-11 MED ORDER — LIDOCAINE HCL (PF) 1 % IJ SOLN
INTRAMUSCULAR | Status: AC
  Filled 2016-11-11: qty 30

## 2016-11-11 MED ORDER — SODIUM CHLORIDE 0.9 % WEIGHT BASED INFUSION
3.0000 mL/kg/h | INTRAVENOUS | Status: DC
  Administered 2016-11-11: 3 mL/kg/h via INTRAVENOUS

## 2016-11-11 MED ORDER — HEPARIN (PORCINE) IN NACL 2-0.9 UNIT/ML-% IJ SOLN
INTRAMUSCULAR | Status: AC
  Filled 2016-11-11: qty 1000

## 2016-11-11 MED ORDER — MIDAZOLAM HCL 2 MG/2ML IJ SOLN
INTRAMUSCULAR | Status: DC | PRN
  Administered 2016-11-11: 2 mg via INTRAVENOUS

## 2016-11-11 MED ORDER — SODIUM CHLORIDE 0.9 % IV SOLN
INTRAVENOUS | Status: AC
  Administered 2016-11-11: 18:00:00 via INTRAVENOUS

## 2016-11-11 MED ORDER — IOPAMIDOL (ISOVUE-370) INJECTION 76%
INTRAVENOUS | Status: DC | PRN
  Administered 2016-11-11: 100 mL via INTRAVENOUS

## 2016-11-11 MED ORDER — NITROGLYCERIN IN D5W 200-5 MCG/ML-% IV SOLN
2.0000 ug/min | INTRAVENOUS | Status: DC
  Administered 2016-11-11: 2 ug/min via INTRAVENOUS
  Filled 2016-11-11: qty 250

## 2016-11-11 MED ORDER — HEPARIN SODIUM (PORCINE) 1000 UNIT/ML IJ SOLN
INTRAMUSCULAR | Status: DC | PRN
  Administered 2016-11-11: 4000 [IU] via INTRAVENOUS

## 2016-11-11 MED ORDER — VERAPAMIL HCL 2.5 MG/ML IV SOLN
INTRAVENOUS | Status: AC
  Filled 2016-11-11: qty 2

## 2016-11-11 MED ORDER — SODIUM CHLORIDE 0.9% FLUSH: 3.0000 mL | Freq: Two times a day (BID) | INTRAVENOUS | Status: DC

## 2016-11-11 MED ORDER — SODIUM CHLORIDE 0.9 % WEIGHT BASED INFUSION
1.0000 mL/kg/h | INTRAVENOUS | Status: DC
  Administered 2016-11-11: 1 mL/kg/h via INTRAVENOUS

## 2016-11-11 MED ORDER — FENTANYL CITRATE (PF) 100 MCG/2ML IJ SOLN
INTRAMUSCULAR | Status: DC | PRN
  Administered 2016-11-11: 50 ug via INTRAVENOUS

## 2016-11-11 MED ORDER — HEPARIN SODIUM (PORCINE) 1000 UNIT/ML IJ SOLN
INTRAMUSCULAR | Status: AC
  Filled 2016-11-11: qty 1

## 2016-11-11 MED ORDER — HEPARIN BOLUS VIA INFUSION
4000.0000 [IU] | Freq: Once | INTRAVENOUS | Status: AC
  Administered 2016-11-11: 4000 [IU] via INTRAVENOUS
  Filled 2016-11-11: qty 4000

## 2016-11-11 MED ORDER — ASPIRIN 81 MG PO CHEW
81.0000 mg | CHEWABLE_TABLET | Freq: Every day | ORAL | Status: DC
  Administered 2016-11-12: 81 mg via ORAL
  Filled 2016-11-11: qty 1

## 2016-11-11 MED ORDER — CLOPIDOGREL BISULFATE 75 MG PO TABS
75.0000 mg | ORAL_TABLET | Freq: Every day | ORAL | Status: DC
  Administered 2016-11-12: 75 mg via ORAL
  Filled 2016-11-11: qty 1

## 2016-11-11 MED ORDER — MIDAZOLAM HCL 2 MG/2ML IJ SOLN
INTRAMUSCULAR | Status: AC
  Filled 2016-11-11: qty 2

## 2016-11-11 MED ORDER — FENTANYL CITRATE (PF) 100 MCG/2ML IJ SOLN
INTRAMUSCULAR | Status: AC
  Filled 2016-11-11: qty 2

## 2016-11-11 MED ORDER — SODIUM CHLORIDE 0.9% FLUSH: 3.0000 mL | INTRAVENOUS | Status: DC | PRN

## 2016-11-11 MED ORDER — HEPARIN (PORCINE) IN NACL 100-0.45 UNIT/ML-% IJ SOLN
1000.0000 [IU]/h | INTRAMUSCULAR | Status: DC
  Administered 2016-11-11: 1000 [IU]/h via INTRAVENOUS
  Filled 2016-11-11: qty 250

## 2016-11-11 MED ORDER — HEPARIN (PORCINE) IN NACL 2-0.9 UNIT/ML-% IJ SOLN
INTRAMUSCULAR | Status: DC | PRN
  Administered 2016-11-11: 1000 mL

## 2016-11-11 MED ORDER — VERAPAMIL HCL 2.5 MG/ML IV SOLN
INTRAVENOUS | Status: DC | PRN
  Administered 2016-11-11: 10 mL via INTRA_ARTERIAL

## 2016-11-11 MED ORDER — IOPAMIDOL (ISOVUE-370) INJECTION 76%
INTRAVENOUS | Status: AC
  Filled 2016-11-11: qty 100

## 2016-11-11 MED ORDER — LIDOCAINE HCL (PF) 1 % IJ SOLN
INTRAMUSCULAR | Status: DC | PRN
  Administered 2016-11-11: 2 mL

## 2016-11-11 MED ORDER — ATORVASTATIN CALCIUM 80 MG PO TABS: 80.0000 mg | ORAL_TABLET | Freq: Every day | ORAL | Status: DC

## 2016-11-11 SURGICAL SUPPLY — 12 items

## 2016-11-11 NOTE — Progress Notes (Signed)
ANTICOAGULATION CONSULT NOTE - Initial Consult  Pharmacy Consult for heparin  Indication: chest pain/ACS  Allergies  Allergen Reactions  . Penicillins Rash    Has patient had a PCN reaction causing immediate rash, facial/tongue/throat swelling, SOB or lightheadedness with hypotension: No Has patient had a PCN reaction causing severe rash involving mucus membranes or skin necrosis: No Has patient had a PCN reaction that required hospitalization: No Has patient had a PCN reaction occurring within the last 10 years: No If all of the above answers are "NO", then may proceed with Cephalosporin use.    Patient Measurements: Height: 5\' 9"  (175.3 cm) Weight: 175 lb (79.4 kg) IBW/kg (Calculated) : 70.7 Heparin Dosing Weight: 79.4kg  Vital Signs: Temp: 97.9 F (36.6 C) (05/25 0644) Temp Source: Oral (05/25 0644) BP: 132/88 (05/25 0644) Pulse Rate: 51 (05/25 0644)  Labs:  Recent Labs  11/10/16 0831 11/10/16 1011 11/10/16 1220 11/10/16 1502 11/11/16 0513  HGB 15.6  --   --   --  14.6  HCT 45.7  --   --   --  43.6  PLT 216  --   --   --  213  LABPROT  --   --   --   --  13.3  INR  --   --   --   --  1.01  CREATININE 1.01  --   --   --  0.98  TROPONINI  --  <0.03 <0.03 <0.03  --     Estimated Creatinine Clearance: 88.2 mL/min (by C-G formula based on SCr of 0.98 mg/dL).   Medical History: Past Medical History:  Diagnosis Date  . Borderline hypertension   . Chest pain, moderate coronary artery risk 04/24/2011  . Family history of adverse reaction to anesthesia    "my father used to be slow to wake up from it"  . GERD (gastroesophageal reflux disease)     Assessment: [age] YOM with no significant previous CAD history admitted with CP, troponin neg x 3, but had some nonspecific ST abnormality and bradycardia. Pharmacy is consulted to start IV heparin, plan for cath later today. Baseline CBC wnl, not on anticoagulation PTA.  Goal of Therapy:  Heparin level 0.3-0.7  units/ml Monitor platelets by anticoagulation protocol: Yes   Plan:  - Heparin bolus 4000 units - Heparin infusion 1000 units/hr - f/u 6 hr heparin level at 1700 - f/u after cath.  Bayard HuggerMei Vence Lalor, PharmD, BCPS  Clinical Pharmacist  Pager: [phone]   11/11/2016,10:33 AM

## 2016-11-11 NOTE — Interval H&P Note (Signed)
History and Physical Interval Note:  11/11/2016 4:10 PM  Zachary Raymond  has presented today for cardiac cath with the diagnosis of unstable angina. The various methods of treatment have been discussed with the patient and family. After consideration of risks, benefits and other options for treatment, the patient has consented to  Procedure(s): Left Heart Cath and Coronary Angiography (N/A) as a surgical intervention .  The patient's history has been reviewed, patient examined, no change in status, stable for surgery.  I have reviewed the patient's chart and labs.  Questions were answered to the patient's satisfaction.  Cath Lab Visit (complete for each Cath Lab visit)  Clinical Evaluation Leading to the Procedure:   ACS: No.  Non-ACS:    Anginal Classification: CCS III  Anti-ischemic medical therapy: No Therapy  Non-Invasive Test Results: No non-invasive testing performed  Prior CABG: No previous CABG           Verne Carrowhristopher Troi Florendo

## 2016-11-11 NOTE — Progress Notes (Signed)
  Echocardiogram 2D Echocardiogram has been performed.  Zachary Raymond, Zachary Raymond 11/11/2016, 12:23 PM

## 2016-11-11 NOTE — H&P (View-Only) (Signed)
CARDIOLOGY CONSULT NOTE   Patient ID: Zachary Raymond MRN: [id number] DOB/AGE: 03-10-1966 51 y.o.  Admit date: 11/10/2016  Primary Physician   Blair Heys, MD Primary Cardiologist   Dr Antoine Poche admitted in 2012 for CP Reason for Consultation   Chest pain Requesting MD: Dr Cena Benton  HPI: Zachary Raymond is a 51 y.o. male with hx of borderline HTN, GERD.  Pt is being seen today for the evaluation of chest pain at the request of Dr Cena Benton.  Pt got a physical last year (first one in a long time). He has borderline HTN. Lipids were good, LDL was 107, HDL 57. No diabetes. No tobacco. He exercises 3 x week, lifting weights and doing cardio. Never gets CP or SOB w/ exertion.   He was driving to work yesterday and had some tightness in his chest. The pain radiated down both arms and down R leg. He got hot but was not diaphoretic. No N&V or SOB. He had a headache, thought it was sinuses. The other symptoms eased, but the tightness persisted. He drove to the hospital. Sx similar to what he had in 2012. He has not had these symptoms since then. He does not get them with exertion.   In the ER, the sx had largely eased but the headache persisted (he had not had any caffeine). He got ASA 324 mg and Tylenol. The HA finally eased off. He has not had recurrent sx since admission.    Past Medical History:  Diagnosis Date  . Borderline hypertension   . Chest pain, moderate coronary artery risk 04/24/2011  . Family history of adverse reaction to anesthesia    "my father used to be slow to wake up from it"  . GERD (gastroesophageal reflux disease)      Past Surgical History:  Procedure Laterality Date  . COLONOSCOPY  06/2016   "diverticulitis"    Allergies  Allergen Reactions  . Penicillins Rash    Has patient had a PCN reaction causing immediate rash, facial/tongue/throat swelling, SOB or lightheadedness with hypotension: No Has patient had a PCN reaction causing severe rash involving mucus  membranes or skin necrosis: No Has patient had a PCN reaction that required hospitalization: No Has patient had a PCN reaction occurring within the last 10 years: No If all of the above answers are "NO", then may proceed with Cephalosporin use.    I have reviewed the patient's current medications . aspirin EC  325 mg Oral Daily  . enoxaparin (LOVENOX) injection  40 mg Subcutaneous Q24H  . sodium chloride flush  3 mL Intravenous Q12H   . sodium chloride Stopped (11/11/16 0501)  . dextrose 5 % and 0.45 % NaCl with KCl 20 mEq/L 100 mL/hr at 11/11/16 0501   acetaminophen **OR** acetaminophen, bisacodyl, gi cocktail, hydrALAZINE, HYDROcodone-acetaminophen, morphine injection, nitroGLYCERIN, ondansetron **OR** ondansetron (ZOFRAN) IV, senna-docusate  Prior to Admission medications   Medication Sig Start Date End Date Taking? Authorizing Provider  ibuprofen (ADVIL,MOTRIN) 200 MG tablet Take 400 mg by mouth every 6 (six) hours as needed for headache or mild pain.   Yes [provider]     Social History   Social History  . Marital status: Married    Spouse name: N/A  . Number of children: N/A  . Years of education: N/A   Occupational History  . Owner and [employer]   Social History Main Topics  . Smoking status: Never Smoker  . Smokeless tobacco: Never Used  .  Alcohol use 1.2 oz/week    2 Cans of beer per week  . Drug use: No  . Sexual activity: Yes   Other Topics Concern  . Not on file   Social History Narrative   Pt lives with wife near [place], [place].     Family Status  Relation Status  . MGF Deceased  . Mother Alive  . Father Deceased  . Sister Alive   Family History  Problem Relation Age of Onset  . Coronary artery disease Maternal Grandfather        CABG in 60's  . Sudden death Father 67       Farming accident (not cardiac)     ROS:  Full 14 point review of systems complete and found to be negative unless listed above.  Physical  Exam: Blood pressure 132/88, pulse (!) 51, temperature 97.9 F (36.6 C), temperature source Oral, resp. rate 18, height 5\' 9"  (1.753 m), weight 175 lb (79.4 kg), SpO2 99 %.  General: Well developed, well nourished, male in no acute distress Head: Eyes PERRLA, No xanthomas.   Normocephalic and atraumatic, oropharynx without edema or exudate. Dentition: good Lungs: clear bilaterally Heart: HRRR S1 S2, no rub/gallop, no murmur. pulses are 2+ all 4 extrem.   Neck: No carotid bruits. No lymphadenopathy.  JVD not elevated Abdomen: Bowel sounds present, abdomen soft and non-tender without masses or hernias noted. Msk:  No spine or cva tenderness. No weakness, no joint deformities or effusions. Extremities: No clubbing or cyanosis. No edema.  Neuro: Alert and oriented X 3. No focal deficits noted. Psych:  Good affect, responds appropriately Skin: No rashes or lesions noted.  Labs:   Lab Results  Component Value Date   WBC 5.7 11/11/2016   HGB 14.6 11/11/2016   HCT 43.6 11/11/2016   MCV 92.2 11/11/2016   PLT 213 11/11/2016    Recent Labs  11/11/16 0513  INR 1.01     Recent Labs Lab 11/11/16 0513  NA 141  K 3.6  CL 106  CO2 28  BUN 11  CREATININE 0.98  CALCIUM 8.7*  PROT 5.9*  BILITOT 0.6  ALKPHOS 48  ALT 19  AST 20  GLUCOSE 102*  ALBUMIN 3.6   No results found for: MG  Recent Labs  11/10/16 1011 11/10/16 1220 11/10/16 1502  TROPONINI <0.03 <0.03 <0.03    Recent Labs  11/10/16 0854  TROPIPOC 0.01   No results found for: BNP Lab Results  Component Value Date   CHOL 170 11/11/2016   HDL 58 11/11/2016   LDLCALC 100 (H) 11/11/2016   TRIG 59 11/11/2016    Echo: ordered  ECG:  SR, QRS duration 118 ms, initial ECG w/ deeper T wave inversions than on today's ECG, MD to review  Cath: n/a  Radiology:  Dg Chest 2 View  Result Date: 11/10/2016 CLINICAL DATA:  Chest pain EXAM: CHEST  2 VIEW COMPARISON:  04/23/2011 FINDINGS: Heart and mediastinal contours are  within normal limits. No focal opacities or effusions. No acute bony abnormality. IMPRESSION: No active cardiopulmonary disease. Electronically Signed   By: Charlett Nose M.D.   On: 11/10/2016 09:02    ASSESSMENT AND PLAN:   The patient was seen today by Dr Mayford Knife, the patient evaluated and the data reviewed.  Principal Problem:   Chest pain, moderate coronary artery risk - pt with no hx exertional sx, lipids are ok, BP is generally > 130/80 but pt has not been treated for HTN.  - Other  CRFs are age, gender, FH CAD in grandfather - sx are atypical, enzymes are negative, but ECG not completely normal - MD advise on MV, inpt or outpt vs cath.  Otherwise, per IM, consider starting a low dose of BP med such as amlodipine. Active Problems:   Hypertension   New onset of headaches   Signed: Leanna BattlesBarrett, Alana Dayton, PA-C 11/11/2016 8:32 AM Beeper [phone]  Co-Sign MD

## 2016-11-11 NOTE — Plan of Care (Signed)
Problem: Safety: Goal: Ability to remain free from injury will improve Outcome: Progressing Patient is independent, ambulates and tolerates well.

## 2016-11-11 NOTE — Progress Notes (Signed)
Nutrition Brief Note  RD consulted for diet education and to educate on minimizing caffeine intake. Patient was unavailable during attempted time of visit. Handout "Heart Healthy Nutrition Therapy" from the Academy of Nutrition and Dietetics Manual was put at patient bedside. RD contact information given if questions arise.   Roslyn SmilingStephanie Lourie Retz, MS, RD, LDN Pager # [phone] After hours/ weekend pager # [phone]

## 2016-11-11 NOTE — Progress Notes (Signed)
PROGRESS NOTE    NUR KRASINSKI  ZOX:[id number] DOB: 12-06-1965 DOA: 11/10/2016 PCP: Blair Heys, MD    Brief Narrative:   51 y.o. male with a Past Medical History of GERD who presents with CP. Cardiology consulted who took patient for cardiac cath   Assessment & Plan:   Principal Problem:   Chest pain, moderate coronary artery risk - Cardiology on board and assisting with management. Will defer further recommendations to them. Patient is for Today  Active Problems:   Hypertension - Blood pressures have remained elevated. I do not know what medications cardiology may prescribe that may affect blood pressures. Considering adding amlodipine  DVT prophylaxis: heparin Code Status: Full Family Communication: d/c spouse Disposition Plan: per cardiology   Consultants:   Per Cardiology   Procedures: Cardiac Cath   Antimicrobials: none   Subjective: Pt has no new complaints. No acute issues overnight.  Objective: Vitals:   11/11/16 1642 11/11/16 1647 11/11/16 1651 11/11/16 1656  BP: (!) 152/98 (!) 144/93 (!) 144/101 (!) 155/101  Pulse: 65 (!) 57 (!) 56 (!) 59  Resp: 16 13 14  (!) 22  Temp:      TempSrc:      SpO2: 98% 98% 99% (!) 0%  Weight:      Height:        Intake/Output Summary (Last 24 hours) at 11/11/16 1820 Last data filed at 11/11/16 1500  Gross per 24 hour  Intake                3 ml  Output                0 ml  Net                3 ml   Filed Weights   11/10/16 0824 11/11/16 1012  Weight: 79.4 kg (175 lb) 79.2 kg (174 lb 9.7 oz)    Examination:  General exam: Appears calm and comfortable  Respiratory system: Clear to auscultation. Respiratory effort normal. Cardiovascular system: S1 & S2 heard, RRR. Gastrointestinal system: Abdomen is nondistended, soft and nontender.  Central nervous system: Alert and oriented. No focal neurological deficits. Extremities: Symmetric 5 x 5 power. Skin: No rashes, lesions or ulcers, on limited  exam. Psychiatry: Mood & affect appropriate.   Data Reviewed: I have personally reviewed following labs and imaging studies  CBC:  Recent Labs Lab 11/10/16 0831 11/11/16 0513  WBC 5.7 5.7  HGB 15.6 14.6  HCT 45.7 43.6  MCV 92.1 92.2  PLT 216 213   Basic Metabolic Panel:  Recent Labs Lab 11/10/16 0831 11/11/16 0513  NA 137 141  K 4.1 3.6  CL 103 106  CO2 27 28  GLUCOSE 94 102*  BUN 19 11  CREATININE 1.01 0.98  CALCIUM 9.2 8.7*   GFR: Estimated Creatinine Clearance: 88.2 mL/min (by C-G formula based on SCr of 0.98 mg/dL). Liver Function Tests:  Recent Labs Lab 11/11/16 0513  AST 20  ALT 19  ALKPHOS 48  BILITOT 0.6  PROT 5.9*  ALBUMIN 3.6   No results for input(s): LIPASE, AMYLASE in the last 168 hours. No results for input(s): AMMONIA in the last 168 hours. Coagulation Profile:  Recent Labs Lab 11/11/16 0513  INR 1.01   Cardiac Enzymes:  Recent Labs Lab 11/10/16 1011 11/10/16 1220 11/10/16 1502  TROPONINI <0.03 <0.03 <0.03   BNP (last 3 results) No results for input(s): PROBNP in the last 8760 hours. HbA1C:  Recent Labs  11/10/16 1011  HGBA1C 5.5   CBG: No results for input(s): GLUCAP in the last 168 hours. Lipid Profile:  Recent Labs  11/11/16 0513  CHOL 170  HDL 58  LDLCALC 100*  TRIG 59  CHOLHDL 2.9   Thyroid Function Tests: No results for input(s): TSH, T4TOTAL, FREET4, T3FREE, THYROIDAB in the last 72 hours. Anemia Panel: No results for input(s): VITAMINB12, FOLATE, FERRITIN, TIBC, IRON, RETICCTPCT in the last 72 hours. Sepsis Labs: No results for input(s): PROCALCITON, LATICACIDVEN in the last 168 hours.  No results found for this or any previous visit (from the past 240 hour(s)).    Radiology Studies: Dg Chest 2 View  Result Date: 11/10/2016 CLINICAL DATA:  Chest pain EXAM: CHEST  2 VIEW COMPARISON:  04/23/2011 FINDINGS: Heart and mediastinal contours are within normal limits. No focal opacities or effusions. No  acute bony abnormality. IMPRESSION: No active cardiopulmonary disease. Electronically Signed   By: Charlett NoseKevin  Dover M.D.   On: 11/10/2016 09:02    Scheduled Meds: . [START ON 11/12/2016] aspirin  81 mg Oral Daily  . atorvastatin  20 mg Oral q1800  . [START ON 11/12/2016] clopidogrel  75 mg Oral Q breakfast  . sodium chloride flush  3 mL Intravenous Q12H  . sodium chloride flush  3 mL Intravenous Q12H   Continuous Infusions: . sodium chloride Stopped (11/11/16 0501)  . sodium chloride 75 mL/hr at 11/11/16 1730  . sodium chloride    . dextrose 5 % and 0.45 % NaCl with KCl 20 mEq/L Stopped (11/11/16 1117)     LOS: 0 days    Time spent: > 10 minutes  Penny PiaVEGA, Noriel Guthrie, MD [facility] Pager [phone]  If 7PM-7AM, please contact night-coverage [email or web address] Password [email or web address] 11/11/2016, 6:20 PM

## 2016-11-11 NOTE — Consult Note (Signed)
CARDIOLOGY CONSULT NOTE   Patient ID: Zachary Raymond MRN: [id number] DOB/AGE: 03-10-1966 51 y.o.  Admit date: 11/10/2016  Primary Physician   Blair Heys, MD Primary Cardiologist   Dr Antoine Poche admitted in 2012 for CP Reason for Consultation   Chest pain Requesting MD: Dr Cena Benton  HPI: Zachary Raymond is a 51 y.o. male with hx of borderline HTN, GERD.  Pt is being seen today for the evaluation of chest pain at the request of Dr Cena Benton.  Pt got a physical last year (first one in a long time). He has borderline HTN. Lipids were good, LDL was 107, HDL 57. No diabetes. No tobacco. He exercises 3 x week, lifting weights and doing cardio. Never gets CP or SOB w/ exertion.   He was driving to work yesterday and had some tightness in his chest. The pain radiated down both arms and down R leg. He got hot but was not diaphoretic. No N&V or SOB. He had a headache, thought it was sinuses. The other symptoms eased, but the tightness persisted. He drove to the hospital. Sx similar to what he had in 2012. He has not had these symptoms since then. He does not get them with exertion.   In the ER, the sx had largely eased but the headache persisted (he had not had any caffeine). He got ASA 324 mg and Tylenol. The HA finally eased off. He has not had recurrent sx since admission.    Past Medical History:  Diagnosis Date  . Borderline hypertension   . Chest pain, moderate coronary artery risk 04/24/2011  . Family history of adverse reaction to anesthesia    "my father used to be slow to wake up from it"  . GERD (gastroesophageal reflux disease)      Past Surgical History:  Procedure Laterality Date  . COLONOSCOPY  06/2016   "diverticulitis"    Allergies  Allergen Reactions  . Penicillins Rash    Has patient had a PCN reaction causing immediate rash, facial/tongue/throat swelling, SOB or lightheadedness with hypotension: No Has patient had a PCN reaction causing severe rash involving mucus  membranes or skin necrosis: No Has patient had a PCN reaction that required hospitalization: No Has patient had a PCN reaction occurring within the last 10 years: No If all of the above answers are "NO", then may proceed with Cephalosporin use.    I have reviewed the patient's current medications . aspirin EC  325 mg Oral Daily  . enoxaparin (LOVENOX) injection  40 mg Subcutaneous Q24H  . sodium chloride flush  3 mL Intravenous Q12H   . sodium chloride Stopped (11/11/16 0501)  . dextrose 5 % and 0.45 % NaCl with KCl 20 mEq/L 100 mL/hr at 11/11/16 0501   acetaminophen **OR** acetaminophen, bisacodyl, gi cocktail, hydrALAZINE, HYDROcodone-acetaminophen, morphine injection, nitroGLYCERIN, ondansetron **OR** ondansetron (ZOFRAN) IV, senna-docusate  Prior to Admission medications   Medication Sig Start Date End Date Taking? Authorizing Provider  ibuprofen (ADVIL,MOTRIN) 200 MG tablet Take 400 mg by mouth every 6 (six) hours as needed for headache or mild pain.   Yes [provider]     Social History   Social History  . Marital status: Married    Spouse name: N/A  . Number of children: N/A  . Years of education: N/A   Occupational History  . Owner and [employer]   Social History Main Topics  . Smoking status: Never Smoker  . Smokeless tobacco: Never Used  .  Alcohol use 1.2 oz/week    2 Cans of beer per week  . Drug use: No  . Sexual activity: Yes   Other Topics Concern  . Not on file   Social History Narrative   Pt lives with wife near [place], [place].     Family Status  Relation Status  . MGF Deceased  . Mother Alive  . Father Deceased  . Sister Alive   Family History  Problem Relation Age of Onset  . Coronary artery disease Maternal Grandfather        CABG in 60's  . Sudden death Father 67       Farming accident (not cardiac)     ROS:  Full 14 point review of systems complete and found to be negative unless listed above.  Physical  Exam: Blood pressure 132/88, pulse (!) 51, temperature 97.9 F (36.6 C), temperature source Oral, resp. rate 18, height 5\' 9"  (1.753 m), weight 175 lb (79.4 kg), SpO2 99 %.  General: Well developed, well nourished, male in no acute distress Head: Eyes PERRLA, No xanthomas.   Normocephalic and atraumatic, oropharynx without edema or exudate. Dentition: good Lungs: clear bilaterally Heart: HRRR S1 S2, no rub/gallop, no murmur. pulses are 2+ all 4 extrem.   Neck: No carotid bruits. No lymphadenopathy.  JVD not elevated Abdomen: Bowel sounds present, abdomen soft and non-tender without masses or hernias noted. Msk:  No spine or cva tenderness. No weakness, no joint deformities or effusions. Extremities: No clubbing or cyanosis. No edema.  Neuro: Alert and oriented X 3. No focal deficits noted. Psych:  Good affect, responds appropriately Skin: No rashes or lesions noted.  Labs:   Lab Results  Component Value Date   WBC 5.7 11/11/2016   HGB 14.6 11/11/2016   HCT 43.6 11/11/2016   MCV 92.2 11/11/2016   PLT 213 11/11/2016    Recent Labs  11/11/16 0513  INR 1.01     Recent Labs Lab 11/11/16 0513  NA 141  K 3.6  CL 106  CO2 28  BUN 11  CREATININE 0.98  CALCIUM 8.7*  PROT 5.9*  BILITOT 0.6  ALKPHOS 48  ALT 19  AST 20  GLUCOSE 102*  ALBUMIN 3.6   No results found for: MG  Recent Labs  11/10/16 1011 11/10/16 1220 11/10/16 1502  TROPONINI <0.03 <0.03 <0.03    Recent Labs  11/10/16 0854  TROPIPOC 0.01   No results found for: BNP Lab Results  Component Value Date   CHOL 170 11/11/2016   HDL 58 11/11/2016   LDLCALC 100 (H) 11/11/2016   TRIG 59 11/11/2016    Echo: ordered  ECG:  SR, QRS duration 118 ms, initial ECG w/ deeper T wave inversions than on today's ECG, MD to review  Cath: n/a  Radiology:  Dg Chest 2 View  Result Date: 11/10/2016 CLINICAL DATA:  Chest pain EXAM: CHEST  2 VIEW COMPARISON:  04/23/2011 FINDINGS: Heart and mediastinal contours are  within normal limits. No focal opacities or effusions. No acute bony abnormality. IMPRESSION: No active cardiopulmonary disease. Electronically Signed   By: Charlett Nose M.D.   On: 11/10/2016 09:02    ASSESSMENT AND PLAN:   The patient was seen today by Dr Mayford Knife, the patient evaluated and the data reviewed.  Principal Problem:   Chest pain, moderate coronary artery risk - pt with no hx exertional sx, lipids are ok, BP is generally > 130/80 but pt has not been treated for HTN.  - Other  CRFs are age, gender, FH CAD in grandfather - sx are atypical, enzymes are negative, but ECG not completely normal - MD advise on MV, inpt or outpt vs cath.  Otherwise, per IM, consider starting a low dose of BP med such as amlodipine. Active Problems:   Hypertension   New onset of headaches   Signed: Leanna BattlesBarrett, Zachary Lastinger, PA-C 11/11/2016 8:32 AM Beeper [phone]  Co-Sign MD

## 2016-11-12 ENCOUNTER — Observation Stay (HOSPITAL_COMMUNITY): Payer: 59

## 2016-11-12 DIAGNOSIS — R51 Headache: Secondary | ICD-10-CM | POA: Diagnosis not present

## 2016-11-12 DIAGNOSIS — Z0389 Encounter for observation for other suspected diseases and conditions ruled out: Secondary | ICD-10-CM | POA: Diagnosis not present

## 2016-11-12 DIAGNOSIS — K219 Gastro-esophageal reflux disease without esophagitis: Secondary | ICD-10-CM | POA: Diagnosis not present

## 2016-11-12 DIAGNOSIS — R079 Chest pain, unspecified: Secondary | ICD-10-CM | POA: Diagnosis not present

## 2016-11-12 LAB — CBC
HCT: 43 % (ref 39.0–52.0)
Hemoglobin: 14.4 g/dL (ref 13.0–17.0)
MCH: 31.1 pg (ref 26.0–34.0)
MCHC: 33.5 g/dL (ref 30.0–36.0)
MCV: 92.9 fL (ref 78.0–100.0)
PLATELETS: 201 10*3/uL (ref 150–400)
RBC: 4.63 MIL/uL (ref 4.22–5.81)
RDW: 12.7 % (ref 11.5–15.5)
WBC: 6.5 10*3/uL (ref 4.0–10.5)

## 2016-11-12 LAB — BASIC METABOLIC PANEL
Anion gap: 7 (ref 5–15)
BUN: 12 mg/dL (ref 6–20)
CALCIUM: 8.7 mg/dL — AB (ref 8.9–10.3)
CO2: 26 mmol/L (ref 22–32)
CREATININE: 1.04 mg/dL (ref 0.61–1.24)
Chloride: 104 mmol/L (ref 101–111)
GFR calc Af Amer: >60 mL/min (ref >60)
GLUCOSE: 101 mg/dL — AB (ref 65–99)
Potassium: 3.6 mmol/L (ref 3.5–5.1)
SODIUM: 137 mmol/L (ref 135–145)

## 2016-11-12 MED ORDER — ATORVASTATIN CALCIUM 20 MG PO TABS: 20.0000 mg | ORAL_TABLET | Freq: Every day | ORAL | 0 refills | Status: DC

## 2016-11-12 MED ORDER — ASPIRIN 81 MG PO CHEW: 81.0000 mg | CHEWABLE_TABLET | Freq: Every day | ORAL | 0 refills | Status: AC

## 2016-11-12 MED ORDER — AMLODIPINE BESYLATE 2.5 MG PO TABS
2.5000 mg | ORAL_TABLET | Freq: Every day | ORAL | Status: DC
  Administered 2016-11-12: 2.5 mg via ORAL
  Filled 2016-11-12: qty 1

## 2016-11-12 MED ORDER — AMLODIPINE BESYLATE 2.5 MG PO TABS: 2.5000 mg | ORAL_TABLET | Freq: Every day | ORAL | 0 refills | Status: DC

## 2016-11-12 MED ORDER — IOPAMIDOL (ISOVUE-370) INJECTION 76%
INTRAVENOUS | Status: AC
  Administered 2016-11-12: 100 mL
  Filled 2016-11-12: qty 100

## 2016-11-12 NOTE — Plan of Care (Signed)
Problem: Activity: Goal: Ability to tolerate increased activity will improve Outcome: Progressing Sinus Huston FoleyBrady on monitor. Asymptomatic. Tolerates ambulation well.

## 2016-11-12 NOTE — Progress Notes (Addendum)
Progress Note  Patient Name: Zachary Raymond Date of Encounter: 11/12/2016  Primary Cardiologist: Hochrein  Subjective   No difficulty overnight. Denies dyspnea. Denies chest pain.  Inpatient Medications    Scheduled Meds: . aspirin  81 mg Oral Daily  . atorvastatin  20 mg Oral q1800  . clopidogrel  75 mg Oral Q breakfast  . sodium chloride flush  3 mL Intravenous Q12H  . sodium chloride flush  3 mL Intravenous Q12H   Continuous Infusions: . sodium chloride 75 mL/hr at 11/12/16 0226  . sodium chloride     PRN Meds: sodium chloride, acetaminophen **OR** acetaminophen, bisacodyl, gi cocktail, hydrALAZINE, HYDROcodone-acetaminophen, morphine injection, ondansetron **OR** ondansetron (ZOFRAN) IV, senna-docusate, sodium chloride flush   Vital Signs    Vitals:   11/11/16 1647 11/11/16 1651 11/11/16 1656 11/12/16 0005  BP: (!) 144/93 (!) 144/101 (!) 155/101 (!) 143/95  Pulse: (!) 57 (!) 56 (!) 59 (!) 51  Resp: 13 14 (!) 22   Temp:      TempSrc:      SpO2: 98% 99% (!) 0% 99%  Weight:      Height:        Intake/Output Summary (Last 24 hours) at 11/12/16 1120 Last data filed at 11/12/16 0300  Gross per 24 hour  Intake          1303.16 ml  Output                0 ml  Net          1303.16 ml   Filed Weights   11/10/16 0824 11/11/16 1012  Weight: 175 lb (79.4 kg) 174 lb 9.7 oz (79.2 kg)    Telemetry    Normal sinus rhythm and bradycardia. - Personally Reviewed  ECG    No new tracing is ordered. - Personally Reviewed  Physical Exam  Healthy-appearing GEN: No acute distress.   Neck: No JVD Cardiac: RRR, no murmurs, rubs, or gallops.  Respiratory: Clear to auscultation bilaterally. GI: Soft, nontender, non-distended  MS: No edema; No deformity. Neuro:  Nonfocal  Psych: Normal affect   Labs    Chemistry Recent Labs Lab 11/10/16 0831 11/11/16 0513 11/12/16 0252  NA 137 141 137  K 4.1 3.6 3.6  CL 103 106 104  CO2 27 28 26   GLUCOSE 94 102* 101*  BUN  19 11 12   CREATININE 1.01 0.98 1.04  CALCIUM 9.2 8.7* 8.7*  PROT  --  5.9*  --   ALBUMIN  --  3.6  --   AST  --  20  --   ALT  --  19  --   ALKPHOS  --  48  --   BILITOT  --  0.6  --   GFRNONAA >60 >60 >60  GFRAA >60 >60 >60  ANIONGAP 7 7 7      Hematology Recent Labs Lab 11/10/16 0831 11/11/16 0513 11/12/16 0252  WBC 5.7 5.7 6.5  RBC 4.96 4.73 4.63  HGB 15.6 14.6 14.4  HCT 45.7 43.6 43.0  MCV 92.1 92.2 92.9  MCH 31.5 30.9 31.1  MCHC 34.1 33.5 33.5  RDW 12.5 12.5 12.7  PLT 216 213 201    Cardiac Enzymes Recent Labs Lab 11/10/16 1011 11/10/16 1220 11/10/16 1502  TROPONINI <0.03 <0.03 <0.03    Recent Labs Lab 11/10/16 0854  TROPIPOC 0.01     BNPNo results for input(s): BNP, PROBNP in the last 168 hours.   DDimer No results for input(s): DDIMER in  the last 168 hours.   Radiology    Ct Angio Chest Aorta W/cm &/or Wo/cm  Result Date: 11/12/2016 CLINICAL DATA:  Question of enlarged aorta. Borderline high blood pressure. Further evaluation requested. Initial encounter. EXAM: CT ANGIOGRAPHY CHEST WITH CONTRAST TECHNIQUE: Multidetector CT imaging of the chest was performed using the standard protocol during bolus administration of intravenous contrast. Multiplanar CT image reconstructions and MIPs were obtained to evaluate the vascular anatomy. CONTRAST:  80 mL of Isovue 370 IV contrast COMPARISON:  Chest radiograph performed 11/10/2016 FINDINGS: Cardiovascular: There is borderline aneurysmal dilatation of the ascending thoracic aorta to 4.2 cm in AP dimension. There is no additional evidence of aneurysmal dilatation. The aortic arch is normal in caliber. No significant calcific atherosclerotic disease is seen. The great vessels are unremarkable in appearance. There is no evidence of aortic dissection. The heart is borderline normal in size. There is no evidence of significant pulmonary embolus. Mediastinum/Nodes: The mediastinum is unremarkable in appearance. No  mediastinal lymphadenopathy is seen. No pericardial effusion is identified. The visualized portions of the thyroid gland are unremarkable. No axillary lymphadenopathy is seen. Lungs/Pleura: Mild bibasilar atelectasis is noted. No pleural effusion or pneumothorax is seen. No masses are identified. Upper Abdomen: The visualized portions of the liver and spleen are unremarkable. The visualized portions of the gallbladder are within normal limits. The visualized portions of the pancreas, adrenal glands and kidneys are within normal limits. Musculoskeletal: No acute osseous abnormalities are identified. The visualized musculature is unremarkable in appearance. Review of the MIP images confirms the above findings. IMPRESSION: 1. 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 Recommend annual imaging followup by CTA or MRA. This recommendation follows 2010 ACCF/AHA/AATS/ACR/ASA/SCA/SCAI/SIR/STS/SVM Guidelines for the Diagnosis and Management of Patients with Thoracic Aortic Disease. Circulation. 2010; 121: Z610-R604 2. No evidence of aortic dissection. No evidence of significant pulmonary embolus. No significant calcific atherosclerotic disease seen. 3. Mild bibasilar atelectasis noted.  Lungs otherwise clear. Electronically Signed   By: Roanna Raider M.D.   On: 11/12/2016 02:56    Cardiac Studies   Coronary angiography results from 11/11/2016: Coronary Diagrams   Diagnostic Diagram        LV end-diastolic pressure and systolic function were noted to be normal.  Patient Profile     51 y.o. male presented with chest pain and found to have widely patent coronaries by angiography on 11/11/16. "Sluggish flow in all 3 vessels". Additionally has a history of essential hypertension. After angiography Dr. Lynder Parents recommended aspirin/Plavix/statin. Chest CT prior to discharge was ordered because of possible aortic root enlargement. CT completed and patient noted to  have borderline aneurysmal dilatation of 4.2 cm.  Assessment & Plan    1. Chest discomfort, with uncertain etiology at this point. No significant obstructive coronary disease identified. Concern raised about sluggish flow in the coronaries which could represent microvascular disease. Management for this entity is aggressive lipid control and antiplatelet therapy with aspirin. I'm not sure he needs to be on Plavix long-term. 2. Mildly dilated ascending aorta. Needs outpatient cardiology follow-up and surveillance CT scan in one year to follow aorta size. 3. Hypertension. Because of bradycardia unable to use beta blocker therapy. Start amlodipine 2.5 mg per day and titrate as outpatient.  If the patient ambulates and has no difficulty, from cardiac standpoint is potentially ready for discharge.  Signed, Lesleigh Noe, MD  11/12/2016, 11:20 AM

## 2016-11-12 NOTE — Plan of Care (Signed)
Problem: Pain Managment: Goal: General experience of comfort will improve Outcome: Progressing Pt denies any chest pain throughout shift.  Problem: Tissue Perfusion: Goal: Risk factors for ineffective tissue perfusion will decrease Outcome: Progressing Vital signs stable. Pt SB/NSR overnight. Lowest of 39. Asymptomatic. Went down for CTA.

## 2016-11-12 NOTE — Discharge Summary (Signed)
Physician Discharge Summary  Zachary Raymond WUJ:[id number] DOB: 08/29/1965 DOA: 11/10/2016  PCP: Zachary Raymond, Robert, MD  Admit date: 11/10/2016 Discharge date: 11/12/2016  Time spent: > 35 minutes  Recommendations for Outpatient Follow-up:  1. Monitor blood pressures and adjust blood pressure medication accordingly. 2. CT completed and patient noted to have borderline aneurysmal dilatation of 4.2 cm. as such cardiology recommends yearly monitoring   Discharge Diagnoses:  Principal Problem:   Chest pain, moderate coronary artery risk Active Problems:   Hypertension   New onset of headaches   Bradycardia   Chest pain   Discharge Condition: stable  Diet recommendation: Heart healthy  Filed Weights   11/10/16 0824 11/11/16 1012  Weight: 79.4 kg (175 lb) 79.2 kg (174 lb 9.7 oz)    History of present illness:  51 y.o.malewith a Past Medical History of GERD who presents with CP. Cardiology consulted who took patient for cardiac cath  Hospital Course:  Principal Problem:   Chest pain, moderate coronary artery risk - Cardiology evaluated patient will he was in house. Stated the following: Chest discomfort, with uncertain etiology at this point. No significant obstructive coronary disease identified. Concern raised about sluggish flow in the coronaries which could represent microvascular disease. Management for this entity is aggressive lipid control and antiplatelet therapy with aspirin. I'm not sure he needs to be on Plavix long-term.  Active Problems:   Hypertension - We'll start low-dose amlodipine which can be titrated as outpatient  Procedures:  Cardiac cath and chest CT  Consultations:  Cardiology: Dr. Katrinka BlazingSmith  Discharge Exam: Vitals:   11/11/16 1656 11/12/16 0005  BP: (!) 155/101 (!) 143/95  Pulse: (!) 59 (!) 51  Resp: (!) 22   Temp:      General: Pt in nad, alert and awake Cardiovascular: rrr, no rubs Respiratory: no increased wob, no wheezes  Discharge  Instructions   Discharge Instructions    Call MD for:  difficulty breathing, headache or visual disturbances    Complete by:  As directed    Call MD for:  temperature >100.4    Complete by:  As directed    Diet - low sodium heart healthy    Complete by:  As directed    Discharge instructions    Complete by:  As directed    Please be sure to follow up with your cardiologist after hospital discharge. Call their office to get f/u appointment date and time.   Increase activity slowly    Complete by:  As directed      Current Discharge Medication List    START taking these medications   Details  amLODipine (NORVASC) 2.5 MG tablet Take 1 tablet (2.5 mg total) by mouth daily. Qty: 30 tablet, Refills: 0    aspirin 81 MG chewable tablet Chew 1 tablet (81 mg total) by mouth daily. Qty: 30 tablet, Refills: 0    atorvastatin (LIPITOR) 20 MG tablet Take 1 tablet (20 mg total) by mouth daily at 6 PM. Qty: 30 tablet, Refills: 0      STOP taking these medications     ibuprofen (ADVIL,MOTRIN) 200 MG tablet        Allergies  Allergen Reactions  . Penicillins Rash    Has patient had a PCN reaction causing immediate rash, facial/tongue/throat swelling, SOB or lightheadedness with hypotension: No Has patient had a PCN reaction causing severe rash involving mucus membranes or skin necrosis: No Has patient had a PCN reaction that required hospitalization: No Has patient had a PCN  reaction occurring within the last 10 years: No If all of the above answers are "NO", then may proceed with Cephalosporin use.   Follow-up Information    Allayne Butcher, PA-C Follow up on 11/25/2016.   Specialties:  Cardiology, Radiology Why:  Please arrive at 1:15 pm for a 1:30 pm appt. Contact information: [address] [place] [address] [phone]            The results of significant diagnostics from this hospitalization (including imaging, microbiology, ancillary and  laboratory) are listed below for reference.    Significant Diagnostic Studies: Dg Chest 2 View  Result Date: 11/10/2016 CLINICAL DATA:  Chest pain EXAM: CHEST  2 VIEW COMPARISON:  04/23/2011 FINDINGS: Heart and mediastinal contours are within normal limits. No focal opacities or effusions. No acute bony abnormality. IMPRESSION: No active cardiopulmonary disease. Electronically Signed   By: Charlett Nose M.D.   On: 11/10/2016 09:02   Ct Angio Chest Aorta W/cm &/or Wo/cm  Result Date: 11/12/2016 CLINICAL DATA:  Question of enlarged aorta. Borderline high blood pressure. Further evaluation requested. Initial encounter. EXAM: CT ANGIOGRAPHY CHEST WITH CONTRAST TECHNIQUE: Multidetector CT imaging of the chest was performed using the standard protocol during bolus administration of intravenous contrast. Multiplanar CT image reconstructions and MIPs were obtained to evaluate the vascular anatomy. CONTRAST:  80 mL of Isovue 370 IV contrast COMPARISON:  Chest radiograph performed 11/10/2016 FINDINGS: Cardiovascular: There is borderline aneurysmal dilatation of the ascending thoracic aorta to 4.2 cm in AP dimension. There is no additional evidence of aneurysmal dilatation. The aortic arch is normal in caliber. No significant calcific atherosclerotic disease is seen. The great vessels are unremarkable in appearance. There is no evidence of aortic dissection. The heart is borderline normal in size. There is no evidence of significant pulmonary embolus. Mediastinum/Nodes: The mediastinum is unremarkable in appearance. No mediastinal lymphadenopathy is seen. No pericardial effusion is identified. The visualized portions of the thyroid gland are unremarkable. No axillary lymphadenopathy is seen. Lungs/Pleura: Mild bibasilar atelectasis is noted. No pleural effusion or pneumothorax is seen. No masses are identified. Upper Abdomen: The visualized portions of the liver and spleen are unremarkable. The visualized portions of  the gallbladder are within normal limits. The visualized portions of the pancreas, adrenal glands and kidneys are within normal limits. Musculoskeletal: No acute osseous abnormalities are identified. The visualized musculature is unremarkable in appearance. Review of the MIP images confirms the above findings. IMPRESSION: 1. 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 Recommend annual imaging followup by CTA or MRA. This recommendation follows 2010 ACCF/AHA/AATS/ACR/ASA/SCA/SCAI/SIR/STS/SVM Guidelines for the Diagnosis and Management of Patients with Thoracic Aortic Disease. Circulation. 2010; 121: B147-W295 2. No evidence of aortic dissection. No evidence of significant pulmonary embolus. No significant calcific atherosclerotic disease seen. 3. Mild bibasilar atelectasis noted.  Lungs otherwise clear. Electronically Signed   By: Roanna Raider M.D.   On: 11/12/2016 02:56    Microbiology: No results found for this or any previous visit (from the past 240 hour(s)).   Labs: Basic Metabolic Panel:  Recent Labs Lab 11/10/16 0831 11/11/16 0513 11/12/16 0252  NA 137 141 137  K 4.1 3.6 3.6  CL 103 106 104  CO2 27 28 26   GLUCOSE 94 102* 101*  BUN 19 11 12   CREATININE 1.01 0.98 1.04  CALCIUM 9.2 8.7* 8.7*   Liver Function Tests:  Recent Labs Lab 11/11/16 0513  AST 20  ALT 19  ALKPHOS  48  BILITOT 0.6  PROT 5.9*  ALBUMIN 3.6   No results for input(s): LIPASE, AMYLASE in the last 168 hours. No results for input(s): AMMONIA in the last 168 hours. CBC:  Recent Labs Lab 11/10/16 0831 11/11/16 0513 11/12/16 0252  WBC 5.7 5.7 6.5  HGB 15.6 14.6 14.4  HCT 45.7 43.6 43.0  MCV 92.1 92.2 92.9  PLT 216 213 201   Cardiac Enzymes:  Recent Labs Lab 11/10/16 1011 11/10/16 1220 11/10/16 1502  TROPONINI <0.03 <0.03 <0.03   BNP: BNP (last 3 results) No results for input(s): BNP in the last 8760 hours.  ProBNP (last 3 results) No  results for input(s): PROBNP in the last 8760 hours.  CBG: No results for input(s): GLUCAP in the last 168 hours.   Signed:  Penny Pia MD.  [facility] 11/12/2016, 1:05 PM

## 2016-11-15 ENCOUNTER — Encounter (HOSPITAL_COMMUNITY): Payer: Self-pay | Admitting: Cardiovascular Disease

## 2016-11-15 MED FILL — Nitroglycerin IV Soln 100 MCG/ML in D5W: INTRA_ARTERIAL | Qty: 10 | Status: AC

## 2016-11-22 ENCOUNTER — Encounter: Payer: Self-pay | Admitting: Cardiology

## 2016-11-25 ENCOUNTER — Encounter: Payer: Self-pay | Admitting: Cardiology

## 2016-11-25 ENCOUNTER — Encounter (INDEPENDENT_AMBULATORY_CARE_PROVIDER_SITE_OTHER): Payer: Self-pay

## 2016-11-25 ENCOUNTER — Ambulatory Visit (INDEPENDENT_AMBULATORY_CARE_PROVIDER_SITE_OTHER): Payer: 59 | Admitting: Cardiology

## 2016-11-25 VITALS — BP 142/86 | HR 64 | Ht 69.0 in | Wt 183.4 lb

## 2016-11-25 DIAGNOSIS — E7849 Other hyperlipidemia: Secondary | ICD-10-CM

## 2016-11-25 DIAGNOSIS — I77819 Aortic ectasia, unspecified site: Secondary | ICD-10-CM | POA: Diagnosis not present

## 2016-11-25 DIAGNOSIS — E784 Other hyperlipidemia: Secondary | ICD-10-CM | POA: Diagnosis not present

## 2016-11-25 MED ORDER — ATORVASTATIN CALCIUM 20 MG PO TABS: 20.0000 mg | ORAL_TABLET | Freq: Every day | ORAL | 3 refills | Status: DC

## 2016-11-25 NOTE — Patient Instructions (Addendum)
Medication Instructions:  Your physician recommends that you continue on your current medications as directed. Please refer to the Current Medication list given to you today.  Lab work: Your physician recommends that you return for lab work in: 6 weeks for fasting lipid and liver panel.  Testing/Procedures: NONE  Follow-Up: Your physician wants you to follow-up in: 3 months with Dr. Clifton JamesMcAlhany.    If you need a refill on your cardiac medications before your next appointment, please call your pharmacy.   If the increased dose of amlodipine is working for you please give our office a call and we will change your medication prescription.  Thoracic Aortic Aneurysm An aneurysm is a bulge in an artery. It happens when blood pushes up against a weakened or damaged artery wall. A thoracic aortic aneurysm is an aneurysm that occurs in the first part of the aorta, between the heart and the diaphragm. The aorta is the main artery of the body. It supplies blood from the heart to the rest of the body. Some aneurysms may not cause symptoms or problems. However, the major concern with a thoracic aortic aneurysm is that it can enlarge and burst (rupture), or blood can flow between the layers of the wall of the aorta through a tear (aortic dissection). Both of these conditions can cause bleeding inside the body and can be life-threatening if they are not diagnosed and treated right away. What are the causes? The exact cause of this condition is not known. What increases the risk? The following factors may make you more likely to develop this condition:  Being age 52 or older.  Having a hardening of the arteries caused by the buildup of fat and other substances in the lining of a blood vessel (arteriosclerosis).  Having inflammation of the walls of an artery (arteritis).  Having a genetic disease that weakens the body's connective tissue, such as Marfan syndrome.  Having an injury or trauma to the  aorta.  Having an infection that is caused by bacteria, such as syphilis or staphylococcus, in the wall of the aorta (infectious aortitis).  Having high blood pressure (hypertension).  Being male.  Being white (Caucasian).  Having high cholesterol.  Having a family history of aneurysms.  Using tobacco.  Having chronic obstructive pulmonary disease (COPD).  What are the signs or symptoms? Symptoms of this condition vary depending on the size and rate of growth of the aneurysm. Most grow slowly and do not cause any symptoms. When symptoms do occur, they may include:  Pain in the chest, back, sides, or abdomen. The pain may vary in intensity. A sudden onset of severe pain may indicate that the aneurysm has ruptured.  Hoarseness.  Cough.  Shortness of breath.  Swallowing problems.  Swelling in the face, arms, or legs.  Fever.  Unexplained weight loss.  How is this diagnosed? This condition may be diagnosed with:  An ultrasound.  X-rays.  A CT scan.  An MRI.  Tests to check the arteries for damage or blockages (angiogram).  Most unruptured thoracic aortic aneurysms cause no symptoms, so they are often found during exams for other conditions. How is this treated? Treatment for this condition depends on:  The size of the aneurysm.  How fast the aneurysm is growing.  Your age.  Risk factors for rupture.  Aneurysms that are smaller than 2.2 inches (5.5 cm) may be managed by using medicines to control blood pressure, manage pain, or fight infection. You may need regular monitoring to  see if the aneurysm is getting bigger. Your health care provider may recommend that you have an ultrasound every year or every 6 months. How often you need to have an ultrasound depends on the size of the aneurysm, how fast it is growing, and whether you have a family history of aneurysms. Surgical repair may be needed if your aneurysm is larger than 2.2 inches or if it is growing  quickly. Follow these instructions at home: Eating and drinking  Eat a healthy diet. Your health care provider may recommend that you: ? Lower your salt (sodium) intake. In some people, too much salt can raise blood pressure and increase the risk of thoracic aortic aneurysm. ? Avoid foods that are high in saturated fat and cholesterol, such as red meat and dairy. ? Eat a diet that is low in sugar. ? Increase your fiber intake by including whole grains, vegetables, and fruits in your diet. Eating these foods may help to lower blood pressure.  Limit or avoid alcohol as recommended by your health care provider. Lifestyle  Follow instructions from your health care provider about healthy lifestyle habits. Your health care provider may recommend that you: ? Do not use any products that contain nicotine or tobacco, such as cigarettes and e-cigarettes. If you need help quitting, ask your health care provider. ? Keep your blood pressure within normal limits. The target limit for most people is below 120/80. Check your blood pressure regularly. If it is high, ask your health care provider about ways that you can control it. ? Keep your blood sugar (glucose) level and cholesterol levels within normal limits. Target limits for most people are:  Blood glucose level: Less than 100 mg/dL.  Total cholesterol level: Less than 200 mg/dL. ? Maintain a healthy weight. Activity  Stay physically active and exercise regularly. Talk with your health care provider about how often you should exercise and ask which types of exercise are safe for you.  Avoid heavy lifting and activities that take a lot of effort (are strenuous). Ask your health care provider what activities are safe for you. General instructions  Keep all follow-up visits as told by your health care provider. This is important. ? Talk with your health care provider about regular screenings to see if the aneurysm is getting bigger.  Take  over-the-counter and prescription medicines only as told by your health care provider. Contact a health care provider if:  You have discomfort in your upper back, neck, or abdomen.  You have trouble swallowing.  You have a cough or hoarseness.  You have a family history of aneurysms.  You have unexplained weight loss. Get help right away if:  You have sudden, severe pain in your upper back and abdomen. This pain may move into your chest and arms.  You have shortness of breath.  You have a fever. This information is not intended to replace advice given to you by your health care provider. Make sure you discuss any questions you have with your health care provider. Document Released: 06/06/2005 Document Revised: 03/18/2016 Document Reviewed: 03/18/2016 Elsevier Interactive Patient Education  [employer].

## 2016-11-25 NOTE — Progress Notes (Signed)
11/25/2016 Zachary Raymond   Dec 29, 1965  [id number]  Primary Physician Zachary Heys, MD Primary Cardiologist: Dr. Clifton Raymond   Reason for Visit/CC: [facility] F/u for Chest Pain   HPI:  Zachary Raymond is a 51 y.o. male who presents to clinic today for post hospital f/u. He was recently admitted for chest pain. He underwent LCH on 11/11/16 and was found to have widely patent coronaries. He was noted to have "sluggish flow in all 3 vessels", which could represent microvascular disease. He was placed on ASA and a statin (LDL 100 mg/dL). Amlodipine 2.5 mg added for BP. However no BB given bradycardia. Of note, he was also noted to have aortic root enlargement. CT of the chest was completed which showed borderline aneurysmal dilation of 4.2 cm. 2D echo showed no AI or AS. Aortic Valve tricuspid. EF normal at 55-60%.   He presents back to clinic today with his wife. He has done well post discharge. No recurrent CP. No dyspnea. Right radial cath site is stable. BP is 142/86. BP has been in the 140s-150s systolic at home. He admits that maintaining a low sodium diet has been difficult but he is trying. He gets regular exercise. He is tolerating new meds w/o side effects.   Current Meds  Medication Sig  . amLODipine (NORVASC) 2.5 MG tablet Take 1 tablet (2.5 mg total) by mouth daily.  Marland Kitchen aspirin 81 MG chewable tablet Chew 1 tablet (81 mg total) by mouth daily.  Marland Kitchen atorvastatin (LIPITOR) 20 MG tablet Take 1 tablet (20 mg total) by mouth daily at 6 PM.  . [DISCONTINUED] atorvastatin (LIPITOR) 20 MG tablet Take 1 tablet (20 mg total) by mouth daily at 6 PM.   Allergies  Allergen Reactions  . Penicillins Rash    Has patient had a PCN reaction causing immediate rash, facial/tongue/throat swelling, SOB or lightheadedness with hypotension: No Has patient had a PCN reaction causing severe rash involving mucus membranes or skin necrosis: No Has patient had a PCN reaction that required hospitalization: No Has  patient had a PCN reaction occurring within the last 10 years: No If all of the above answers are "NO", then may proceed with Cephalosporin use.   Past Medical History:  Diagnosis Date  . Borderline hypertension   . Chest pain, moderate coronary artery risk 04/24/2011  . Family history of adverse reaction to anesthesia    "my father used to be slow to wake up from it"  . GERD (gastroesophageal reflux disease)    Family History  Problem Relation Age of Onset  . Coronary artery disease Maternal Grandfather        CABG in 60's  . Sudden death Father 75       Farming accident (not cardiac)   Past Surgical History:  Procedure Laterality Date  . COLONOSCOPY  06/2016   "diverticulitis"  . LEFT HEART CATH AND CORONARY ANGIOGRAPHY N/A 11/11/2016   Procedure: Left Heart Cath and Coronary Angiography;  Surgeon: Zachary Hazel, MD;  [facility] CV LAB;  Service: Cardiovascular;  Laterality: N/A;   Social History   Social History  . Marital status: Married    Spouse name: N/A  . Number of children: N/A  . Years of education: N/A   Occupational History  . Owner and [employer]   Social History Main Topics  . Smoking status: Never Smoker  . Smokeless tobacco: Never Used  . Alcohol use 1.2 oz/week    2 Cans of beer  per week  . Drug use: No  . Sexual activity: Yes   Other Topics Concern  . Not on file   Social History Narrative   [place], [place].      Review of Systems: General: negative for chills, fever, night sweats or weight changes.  Cardiovascular: negative for chest pain, dyspnea on exertion, edema, orthopnea, palpitations, paroxysmal nocturnal dyspnea or shortness of breath Dermatological: negative for rash Respiratory: negative for cough or wheezing Urologic: negative for hematuria Abdominal: negative for nausea, vomiting, diarrhea, bright red blood per rectum, melena, or hematemesis Neurologic: negative for visual  changes, syncope, or dizziness All other systems reviewed and are otherwise negative except as noted above.   Physical Exam:  Blood pressure (!) 142/86, pulse 64, height 5\' 9"  (1.753 m), weight 183 lb 6.4 oz (83.2 kg).  General appearance: alert, cooperative and no distress Neck: no carotid bruit and no JVD Lungs: clear to auscultation bilaterally Heart: regular rate and rhythm, S1, S2 normal, no murmur, click, rub or gallop Extremities: extremities normal, atraumatic, no cyanosis or edema Pulses: 2+ and symmetric Skin: Skin color, texture, turgor normal. No rashes or lesions Neurologic: Grossly normal  EKG not perormed -- personally reviewed   ASSESSMENT AND PLAN:   1. Chest Pain/ ? Small Vessel Disease: He underwent LCH on 11/11/16 and was found to have widely patent coronaries. He was noted to have "sluggish flow in all 3 vessels", which could represent microvascular disease. Continue medial therapy with ASA and statin, + amlodipine for BP. He has had no recurrent CP. If recurrent issues, can consider initiation of LA nitrate +/- Ranexa. No BB given low resting HR/ bradycardia.   2. LDL: LDL slightly elevated at 100. However given suspected small vessel CAD and aortic disease, aggressive lipid control advised. Continue Lipitor 20 mg. Repeat FLP and HFTs in 6 weeks.   3. Aortic root enlargement: CT of the chest was completed which showed borderline aneurysmal dilation of 4.2 cm. 2D echo showed no AI or AS. Aortic Valve tricuspid. EF normal at 55-60%. Continue management of BP w/ amlodipine. No BB given bradycardia. Pt advised to refrain from heavy weight lifting at the gym. We will continue to monitor this. Repeat 2D echo in 1 year.   4. HTN: mildly elevated. He admits to moderate sodium consumption. Will increase amlodipine to 5 mg daily. Watch sodium consumption. Continue regular exercise.   Follow-Up w/ Dr. Clifton JamesMcAlhany in 2-3 months.   Zachary Raymond Zachary IslamSimmons PA-C, [facility] 11/25/2016 5:18 PM

## 2016-11-30 ENCOUNTER — Other Ambulatory Visit: Payer: Self-pay | Admitting: Cardiology

## 2016-11-30 MED ORDER — AMLODIPINE BESYLATE 2.5 MG PO TABS: 2.5000 mg | ORAL_TABLET | Freq: Two times a day (BID) | ORAL | 11 refills | Status: DC

## 2016-11-30 MED ORDER — ATORVASTATIN CALCIUM 20 MG PO TABS: 20.0000 mg | ORAL_TABLET | Freq: Every day | ORAL | 7 refills | Status: DC

## 2016-12-02 ENCOUNTER — Encounter: Payer: Self-pay | Admitting: Cardiology

## 2016-12-29 DIAGNOSIS — I7789 Other specified disorders of arteries and arterioles: Secondary | ICD-10-CM | POA: Insufficient documentation

## 2016-12-29 DIAGNOSIS — E7800 Pure hypercholesterolemia, unspecified: Secondary | ICD-10-CM | POA: Insufficient documentation

## 2016-12-29 DIAGNOSIS — H02831 Dermatochalasis of right upper eyelid: Secondary | ICD-10-CM | POA: Diagnosis not present

## 2016-12-29 DIAGNOSIS — H02413 Mechanical ptosis of bilateral eyelids: Secondary | ICD-10-CM | POA: Diagnosis not present

## 2016-12-29 DIAGNOSIS — H02834 Dermatochalasis of left upper eyelid: Secondary | ICD-10-CM | POA: Diagnosis not present

## 2017-01-04 ENCOUNTER — Other Ambulatory Visit: Payer: 59 | Admitting: *Deleted

## 2017-01-04 DIAGNOSIS — E7849 Other hyperlipidemia: Secondary | ICD-10-CM

## 2017-01-04 DIAGNOSIS — E784 Other hyperlipidemia: Secondary | ICD-10-CM | POA: Diagnosis not present

## 2017-01-04 LAB — HEPATIC FUNCTION PANEL
ALK PHOS: 55 IU/L (ref 39–117)
ALT: 32 IU/L (ref 0–44)
AST: 26 IU/L (ref 0–40)
Albumin: 4.5 g/dL (ref 3.5–5.5)
BILIRUBIN, DIRECT: 0.2 mg/dL (ref 0.00–0.40)
Bilirubin Total: 0.7 mg/dL (ref 0.0–1.2)
Total Protein: 6.4 g/dL (ref 6.0–8.5)

## 2017-01-04 LAB — LIPID PANEL
CHOLESTEROL TOTAL: 133 mg/dL (ref 100–199)
Chol/HDL Ratio: 2.1 ratio (ref 0.0–5.0)
HDL: 64 mg/dL (ref >39)
LDL CALC: 60 mg/dL (ref 0–99)
TRIGLYCERIDES: 45 mg/dL (ref 0–149)
VLDL CHOLESTEROL CAL: 9 mg/dL (ref 5–40)

## 2017-01-09 ENCOUNTER — Encounter: Payer: Self-pay | Admitting: Cardiology

## 2017-01-10 ENCOUNTER — Encounter: Payer: Self-pay | Admitting: Cardiology

## 2017-02-28 DIAGNOSIS — Z0000 Encounter for general adult medical examination without abnormal findings: Secondary | ICD-10-CM | POA: Diagnosis not present

## 2017-03-06 ENCOUNTER — Ambulatory Visit (INDEPENDENT_AMBULATORY_CARE_PROVIDER_SITE_OTHER): Payer: 59 | Admitting: Cardiovascular Disease

## 2017-03-06 ENCOUNTER — Encounter: Payer: Self-pay | Admitting: Cardiovascular Disease

## 2017-03-06 VITALS — BP 112/80 | HR 47 | Ht 69.0 in | Wt 180.8 lb

## 2017-03-06 DIAGNOSIS — I2510 Atherosclerotic heart disease of native coronary artery without angina pectoris: Secondary | ICD-10-CM | POA: Diagnosis not present

## 2017-03-06 DIAGNOSIS — I712 Thoracic aortic aneurysm, without rupture, unspecified: Secondary | ICD-10-CM

## 2017-03-06 MED ORDER — AMLODIPINE BESYLATE 5 MG PO TABS: 5.0000 mg | ORAL_TABLET | Freq: Every day | ORAL | 11 refills | Status: DC

## 2017-03-06 NOTE — Progress Notes (Signed)
Chief Complaint  Patient presents with  . Follow-up    CAD   History of Present Illness: 51 yo male with history of HTN, GERD and recent hospitalization with chest pain who is here today for cardiac follow up. He was admitted to [facility] May 2018 with chest pain and had negative cardiac markers. Cardiac cath on 11/11/16 with mild non-obstructive CAD (20% mid RCA stenosis, 20% OM1 stenosis). LVEF=55-65% by LV gram. Flow down the coronary arteries was sluggish suggesting possible microvascular disease. He was started on ASA, Norvasc and a statin. No beta blocker given bradycardia. CTA chest with mildly dilated ascending aorta (4.2 cm). Echo with normal LV function, tricuspid aortic valve with no AS or AI.   He is here today for follow up of his CAD. The patient denies any chest pain, dyspnea, palpitations, lower extremity edema, orthopnea, PND, dizziness, near syncope or syncope.    Primary Care Physician: Blair Heys, MD  Past Medical History:  Diagnosis Date  . Borderline hypertension   . Chest pain, moderate coronary artery risk 04/24/2011  . Family history of adverse reaction to anesthesia    "my father used to be slow to wake up from it"  . GERD (gastroesophageal reflux disease)     Past Surgical History:  Procedure Laterality Date  . COLONOSCOPY  06/2016   "diverticulitis"  . LEFT HEART CATH AND CORONARY ANGIOGRAPHY N/A 11/11/2016   Procedure: Left Heart Cath and Coronary Angiography;  Surgeon: Kathleene Hazel, MD;  Location: [facility] INVASIVE CV LAB;  Service: Cardiovascular;  Laterality: N/A;    Current Outpatient Prescriptions  Medication Sig Dispense Refill  . aspirin 81 MG chewable tablet Chew 1 tablet (81 mg total) by mouth daily. 30 tablet 0  . atorvastatin (LIPITOR) 20 MG tablet Take 1 tablet (20 mg total) by mouth daily at 6 PM. 30 tablet 7  . amLODipine (NORVASC) 5 MG tablet Take 1 tablet (5 mg total) by mouth daily. 30 tablet 11   No current  facility-administered medications for this visit.     Allergies  Allergen Reactions  . Penicillins Rash    Has patient had a PCN reaction causing immediate rash, facial/tongue/throat swelling, SOB or lightheadedness with hypotension: No Has patient had a PCN reaction causing severe rash involving mucus membranes or skin necrosis: No Has patient had a PCN reaction that required hospitalization: No Has patient had a PCN reaction occurring within the last 10 years: No If all of the above answers are "NO", then may proceed with Cephalosporin use.    Social History   Social History  . Marital status: Married    Spouse name: N/A  . Number of children: N/A  . Years of education: N/A   Occupational History  . Owner and [employer]   Social History Main Topics  . Smoking status: Never Smoker  . Smokeless tobacco: Never Used  . Alcohol use 1.2 oz/week    2 Cans of beer per week  . Drug use: No  . Sexual activity: Yes   Other Topics Concern  . Not on file   Social History Narrative   Pt lives with wife near [place], [place].     Family History  Problem Relation Age of Onset  . Coronary artery disease Maternal Grandfather        CABG in 60's  . Sudden death Father 84       Farming accident (not cardiac)    Review of Systems:  As stated in  the HPI and otherwise negative.   BP 112/80   Pulse (!) 47   Ht  (1.753 m)   Wt 180 lb 12.8 oz (82 kg)   SpO2 99%   BMI 26.70 kg/m   Physical Examination: General: Well developed, well nourished, NAD  HEENT: OP clear, mucus membranes moist  SKIN: warm, dry. No rashes. Neuro: No focal deficits  Musculoskeletal: Muscle strength 5/5 all ext  Psychiatric: Mood and affect normal  Neck: No JVD, no carotid bruits, no thyromegaly, no lymphadenopathy.  Lungs:Clear bilaterally, no wheezes, rhonci, crackles Cardiovascular: Regular rate and rhythm. No murmurs, gallops or rubs. Abdomen:Soft. Bowel sounds present. Non-tender.    Extremities: No lower extremity edema. Pulses are 2 + in the bilateral DP/PT.  Echo 11/11/16: - Left ventricle: The cavity size was mildly dilated. Wall   thickness was increased in a pattern of mild LVH. Systolic   function was normal. The estimated ejection fraction was in the   range of 55% to 60%. Wall motion was normal; there were no   regional wall motion abnormalities. Doppler parameters are   consistent with abnormal left ventricular relaxation (grade 1   diastolic dysfunction). - Left atrium: The atrium was moderately dilated.  Cardiac cath 11/11/16: Left Circumflex  Vessel is large.  First Obtuse Marginal Branch  Vessel is large in size.  Ost 1st Mrg to 1st Mrg lesion, 20% stenosed.  Second Obtuse Marginal Branch  Vessel is large in size.  Third Obtuse Marginal Branch  Vessel is moderate in size.  Right Coronary Artery  Mid RCA lesion, 20% stenosed.  Wall Motion              Left Heart   Left Ventricle The left ventricular size is normal. The left ventricular systolic function is normal. LV end diastolic pressure is normal. The left ventricular ejection fraction is 55-65% by visual estimate. No regional wall motion abnormalities. There is no evidence of mitral regurgitation.    Coronary Diagrams   Diagnostic Diagram         CTA chest may 2018: 1. 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 Recommend annual imaging followup by CTA or MRA. This recommendation follows 2010 ACCF/AHA/AATS/ACR/ASA/SCA/SCAI/SIR/STS/SVM Guidelines for the Diagnosis and Management of Patients with Thoracic Aortic Disease. Circulation. 2010; 121: Z610-R604 2. No evidence of aortic dissection. No evidence of significant pulmonary embolus. No significant calcific atherosclerotic disease seen. 3. Mild bibasilar atelectasis noted.  Lungs otherwise clear.  EKG:  EKG is not ordered today. The ekg ordered today demonstrates   Recent  Labs: 11/12/2016: BUN 12; Creatinine, Ser 1.04; Hemoglobin 14.4; Platelets 201; Potassium 3.6; Sodium 137 01/04/2017: ALT 32   Lipid Panel    Component Value Date/Time   CHOL 133 01/04/2017 0752   TRIG 45 01/04/2017 0752   HDL 64 01/04/2017 0752   CHOLHDL 2.1 01/04/2017 0752   CHOLHDL 2.9 11/11/2016 0513   VLDL 12 11/11/2016 0513   LDLCALC 60 01/04/2017 0752     Wt Readings from Last 3 Encounters:  03/06/17 180 lb 12.8 oz (82 kg)  11/25/16 183 lb 6.4 oz (83.2 kg)  11/11/16 174 lb 9.7 oz (79.2 kg)     Other studies Reviewed: Additional studies/ records that were reviewed today include: . Review of the above records demonstrates:   Assessment and Plan:   1. CAD without angina: He is having no chest pain suggestive of angina. He has mild CAD by cath in may 2018.  Will continue ASA and statin. Possible microvascular component so will continue Norvasc (change to Norvasc 5 mg daily from 2.5 mg po BID)  2. Thoracic aortic aneurysm: Mild dilation aortic root at 4.2 cm. Repeat chest CTA June 2019. I have advised against lifting heavy weights.   Current medicines are reviewed at length with the patient today.  The patient does not have concerns regarding medicines.  The following changes have been made:  no change  Labs/ tests ordered today include:   Orders Placed This Encounter  Procedures  . CT ANGIO CHEST AORTA W &/OR WO CONTRAST  . Basic Metabolic Panel (BMET)     Disposition:   FU with me in 12 months   Signed, Verne Carrow, MD 03/06/2017 6:46 PM    [facility] [address] [place], [place], [place]  [address] Phone: [phone]; Fax: [phone]

## 2017-03-06 NOTE — Patient Instructions (Signed)
Medication Instructions:  Your physician has recommended you make the following change in your medication:  Change amlodipine to 5 mg by mouth once daily.     Labwork: Your physician recommends that you return for lab work in: June 2019--week or 2 prior to CT scan  (BMP)   Testing/Procedures: Non-Cardiac CT scanning, (CAT scanning), is a noninvasive, special x-ray that produces cross-sectional images of the body using x-rays and a computer. CT scans help physicians diagnose and treat medical conditions. For some CT exams, a contrast material is used to enhance visibility in the area of the body being studied. CT scans provide greater clarity and reveal more details than regular x-ray exams.  To be done in June 2019    Follow-Up: Your physician recommends that you schedule a follow-up appointment in: 12 months. Please call our office in about 9 months to schedule this appointment.     Any Other Special Instructions Will Be Listed Below (If Applicable).     If you need a refill on your cardiac medications before your next appointment, please call your pharmacy.

## 2017-08-01 ENCOUNTER — Encounter: Payer: Self-pay | Admitting: Cardiovascular Disease

## 2017-08-02 ENCOUNTER — Other Ambulatory Visit: Payer: Self-pay | Admitting: *Deleted

## 2017-08-02 MED ORDER — AMLODIPINE BESYLATE 10 MG PO TABS: 10.0000 mg | ORAL_TABLET | Freq: Every day | ORAL | 11 refills | Status: DC

## 2017-08-24 ENCOUNTER — Encounter: Payer: Self-pay | Admitting: Cardiovascular Disease

## 2017-08-24 MED ORDER — AMLODIPINE BESYLATE 5 MG PO TABS: 5.0000 mg | ORAL_TABLET | Freq: Every day | ORAL | 11 refills | Status: DC

## 2017-10-12 DIAGNOSIS — M1712 Unilateral primary osteoarthritis, left knee: Secondary | ICD-10-CM | POA: Diagnosis not present

## 2017-10-17 DIAGNOSIS — M25562 Pain in left knee: Secondary | ICD-10-CM | POA: Diagnosis not present

## 2017-10-17 DIAGNOSIS — M1712 Unilateral primary osteoarthritis, left knee: Secondary | ICD-10-CM | POA: Diagnosis not present

## 2017-11-14 DIAGNOSIS — M25562 Pain in left knee: Secondary | ICD-10-CM | POA: Diagnosis not present

## 2017-11-27 ENCOUNTER — Other Ambulatory Visit: Payer: 59 | Admitting: *Deleted

## 2017-11-27 DIAGNOSIS — I712 Thoracic aortic aneurysm, without rupture, unspecified: Secondary | ICD-10-CM

## 2017-11-27 LAB — BASIC METABOLIC PANEL
BUN / CREAT RATIO: 14 (ref 9–20)
BUN: 15 mg/dL (ref 6–24)
CALCIUM: 9.3 mg/dL (ref 8.7–10.2)
CHLORIDE: 104 mmol/L (ref 96–106)
CO2: 25 mmol/L (ref 20–29)
Creatinine, Ser: 1.05 mg/dL (ref 0.76–1.27)
GFR calc Af Amer: 93 mL/min/{1.73_m2} (ref >59)
GFR calc non Af Amer: 81 mL/min/{1.73_m2} (ref >59)
GLUCOSE: 90 mg/dL (ref 65–99)
Potassium: 4.2 mmol/L (ref 3.5–5.2)
Sodium: 141 mmol/L (ref 134–144)

## 2017-11-28 ENCOUNTER — Other Ambulatory Visit: Payer: Self-pay | Admitting: Cardiology

## 2017-12-04 ENCOUNTER — Ambulatory Visit (INDEPENDENT_AMBULATORY_CARE_PROVIDER_SITE_OTHER)
Admission: RE | Admit: 2017-12-04 | Discharge: 2017-12-04 | Disposition: A | Discharge status: Home / Self Care | Payer: 59 | Source: Ambulatory Visit | Attending: Cardiovascular Disease | Admitting: Cardiovascular Disease

## 2017-12-04 DIAGNOSIS — I712 Thoracic aortic aneurysm, without rupture, unspecified: Secondary | ICD-10-CM

## 2017-12-04 MED ORDER — IOPAMIDOL (ISOVUE-370) INJECTION 76%
100.0000 mL | Freq: Once | INTRAVENOUS | Status: AC | PRN
  Administered 2017-12-04: 100 mL via INTRAVENOUS

## 2018-01-02 DIAGNOSIS — M1712 Unilateral primary osteoarthritis, left knee: Secondary | ICD-10-CM | POA: Diagnosis not present

## 2018-01-09 DIAGNOSIS — M1712 Unilateral primary osteoarthritis, left knee: Secondary | ICD-10-CM | POA: Diagnosis not present

## 2018-01-16 DIAGNOSIS — M1712 Unilateral primary osteoarthritis, left knee: Secondary | ICD-10-CM | POA: Diagnosis not present

## 2018-02-27 DIAGNOSIS — M170 Bilateral primary osteoarthritis of knee: Secondary | ICD-10-CM | POA: Diagnosis not present

## 2018-02-27 DIAGNOSIS — M25562 Pain in left knee: Secondary | ICD-10-CM | POA: Diagnosis not present

## 2018-03-12 ENCOUNTER — Encounter: Payer: Self-pay | Admitting: Cardiovascular Disease

## 2018-03-12 ENCOUNTER — Ambulatory Visit: Payer: 59 | Admitting: Cardiovascular Disease

## 2018-03-12 VITALS — BP 134/84 | HR 56 | Ht 69.0 in | Wt 179.8 lb

## 2018-03-12 DIAGNOSIS — I2510 Atherosclerotic heart disease of native coronary artery without angina pectoris: Secondary | ICD-10-CM | POA: Diagnosis not present

## 2018-03-12 DIAGNOSIS — I712 Thoracic aortic aneurysm, without rupture, unspecified: Secondary | ICD-10-CM

## 2018-03-12 DIAGNOSIS — I10 Essential (primary) hypertension: Secondary | ICD-10-CM

## 2018-03-12 MED ORDER — LISINOPRIL 5 MG PO TABS: 5.0000 mg | ORAL_TABLET | Freq: Every day | ORAL | 11 refills | Status: DC

## 2018-03-12 NOTE — Patient Instructions (Signed)
Medication Instructions:  Your physician has recommended you make the following change in your medication:  Start lisinopril 5 mg by mouth daily after we call you with results of lab work done on 03/14/18   Labwork: Your physician recommends that you return for lab work on 03/14/18-CMET and Lipid profile.  This will be fasting. The lab opens at 7:30 AM   Testing/Procedures: none  Follow-Up: Your physician recommends that you schedule a follow-up appointment in: 12 months. Please call our office in about 8 months to schedule this appointment    Any Other Special Instructions Will Be Listed Below (If Applicable).     If you need a refill on your cardiac medications before your next appointment, please call your pharmacy.

## 2018-03-12 NOTE — Progress Notes (Signed)
  Chief Complaint  Patient presents with  . Follow-up    CAD   History of Present Illness: Zachary Raymond with history of HTN, CAD, thoracic aortic aneursym and GERD here today for follow up. He was admitted to [facility] May 2018 with chest pain and had negative cardiac markers. Cardiac cath on 11/11/16 with mild non-obstructive CAD (20% mid RCA stenosis, 20% OM1 stenosis). LVEF=55-65% by LV gram. Flow down the coronary arteries was sluggish suggesting possible microvascular disease. He was started on ASA, Norvasc and a statin. No beta blocker given bradycardia. CTA chest with mildly dilated ascending aorta (4.2 cm). Echo May 2018 with normal LV function, LVEF=55-60%, mild LVH, grade 1 diastolic dysfunction, LAE. No significant valve disease. CTA chest June 2019 with 3.9 cm ascending aortic aneurysm.   He is here today for follow up. The patient denies any chest pain, dyspnea, palpitations, lower extremity edema, orthopnea, PND, dizziness, near syncope or syncope. He had leg pains and knee pains and stopped his Lipitor 10 weeks ago. Perhaps mild improvement in pain since then. BP has been elevated at home in the 140s to 150s.   Primary Care Physician: Ehinger, Robert, MD  Past Medical History:  Diagnosis Date  . Borderline hypertension   . Chest pain, moderate coronary artery risk 04/24/2011  . Family history of adverse reaction to anesthesia    "my father used to be slow to wake up from it"  . GERD (gastroesophageal reflux disease)     Past Surgical History:  Procedure Laterality Date  . COLONOSCOPY  06/2016   "diverticulitis"  . LEFT HEART CATH AND CORONARY ANGIOGRAPHY N/A 11/11/2016   Procedure: Left Heart Cath and Coronary Angiography;  Surgeon: McAlhany, Christopher D, MD;  Location: [facility] INVASIVE CV LAB;  Service: Cardiovascular;  Laterality: N/A;    Current Outpatient Medications  Medication Sig Dispense Refill  . aspirin 81 MG chewable tablet Chew 1 tablet (81 mg total) by mouth daily. 30  tablet 0  . amLODipine (NORVASC) 5 MG tablet Take 5 mg by mouth daily.  11  . lisinopril (PRINIVIL,ZESTRIL) 5 MG tablet Take 1 tablet (5 mg total) by mouth daily. 30 tablet 11   No current facility-administered medications for this visit.     Allergies  Allergen Reactions  . Penicillins Rash    Has patient had a PCN reaction causing immediate rash, facial/tongue/throat swelling, SOB or lightheadedness with hypotension: No Has patient had a PCN reaction causing severe rash involving mucus membranes or skin necrosis: No Has patient had a PCN reaction that required hospitalization: No Has patient had a PCN reaction occurring within the last 10 years: No If all of the above answers are "NO", then may proceed with Cephalosporin use.    Social History   Socioeconomic History  . Marital status: Married    Spouse name: Not on file  . Number of children: Not on file  . Years of education: Not on file  . Highest education level: Not on file  Occupational History  . Occupation: Owner and CFO    Employer: [employer]  Social Needs  . Financial resource strain: Not on file  . Food insecurity:    Worry: Not on file    Inability: Not on file  . Transportation needs:    Medical: Not on file    Non-medical: Not on file  Tobacco Use  . Smoking status: Never Smoker  . Smokeless tobacco: Never Used  Substance and Sexual Activity  . Alcohol use:   Yes    Alcohol/week: 2.0 standard drinks    Types: 2 Cans of beer per week  . Drug use: No  . Sexual activity: Yes  Lifestyle  . Physical activity:    Days per week: Not on file    Minutes per session: Not on file  . Stress: Not on file  Relationships  . Social connections:    Talks on phone: Not on file    Gets together: Not on file    Attends religious service: Not on file    Active member of club or organization: Not on file    Attends meetings of clubs or organizations: Not on file    Relationship status: Not on file  .  Intimate partner violence:    Fear of current or ex partner: Not on file    Emotionally abused: Not on file    Physically abused: Not on file    Forced sexual activity: Not on file  Other Topics Concern  . Not on file  Social History Narrative   Pt lives with wife near [place], [place].     Family History  Problem Relation Age of Onset  . Coronary artery disease Maternal Grandfather        CABG in 60's  . Sudden death Father 17       Farming accident (not cardiac)    Review of Systems:  As stated in the HPI and otherwise negative.   BP 134/84   Pulse (!) 56   Ht 5' 9" (1.753 m)   Wt 179 lb 12.8 oz (81.6 kg)   SpO2 93%   BMI 26.55 kg/m   Physical Examination:  General: Well developed, well nourished, NAD  HEENT: OP clear, mucus membranes moist  SKIN: warm, dry. No rashes. Neuro: No focal deficits  Musculoskeletal: Muscle strength 5/5 all ext  Psychiatric: Mood and affect normal  Neck: No JVD, no carotid bruits, no thyromegaly, no lymphadenopathy.  Lungs:Clear bilaterally, no wheezes, rhonci, crackles Cardiovascular: Regular rate and rhythm. No murmurs, gallops or rubs. Abdomen:Soft. Bowel sounds present. Non-tender.  Extremities: No lower extremity edema. Pulses are 2 + in the bilateral DP/PT.  Echo 11/11/16: - Left ventricle: The cavity size was mildly dilated. Wall   thickness was increased in a pattern of mild LVH. Systolic   function was normal. The estimated ejection fraction was in the   range of 55% to 60%. Wall motion was normal; there were no   regional wall motion abnormalities. Doppler parameters are   consistent with abnormal left ventricular relaxation (grade 1   diastolic dysfunction). - Left atrium: The atrium was moderately dilated.  Cardiac cath 11/11/16: Left Circumflex  Vessel is large.  First Obtuse Marginal Branch  Vessel is large in size.  Ost 1st Mrg to 1st Mrg lesion, 20% stenosed.  Second Obtuse Marginal Branch  Vessel is large in size.    Third Obtuse Marginal Branch  Vessel is moderate in size.  Right Coronary Artery  Mid RCA lesion, 20% stenosed.  Wall Motion              Left Heart   Left Ventricle The left ventricular size is normal. The left ventricular systolic function is normal. LV end diastolic pressure is normal. The left ventricular ejection fraction is 55-65% by visual estimate. No regional wall motion abnormalities. There is no evidence of mitral regurgitation.    Coronary Diagrams   Diagnostic Diagram         CTA chest may 2018:  1. 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 Recommend annual imaging followup by CTA or MRA. This recommendation follows 2010 ACCF/AHA/AATS/ACR/ASA/SCA/SCAI/SIR/STS/SVM Guidelines for the Diagnosis and Management of Patients with Thoracic Aortic Disease. Circulation. 2010; 121: G644-I347 2. No evidence of aortic dissection. No evidence of significant pulmonary embolus. No significant calcific atherosclerotic disease seen. 3. Mild bibasilar atelectasis noted.  Lungs otherwise clear.  EKG:  EKG is ordered today. The ekg ordered today demonstrates sinus bradycardia, rate 56 bpm.   Recent Labs: 11/27/2017: BUN 15; Creatinine, Ser 1.05; Potassium 4.2; Sodium 141   Lipid Panel    Component Value Date/Time   CHOL 133 01/04/2017 0752   TRIG 45 01/04/2017 0752   HDL 64 01/04/2017 0752   CHOLHDL 2.1 01/04/2017 0752   CHOLHDL 2.9 11/11/2016 0513   VLDL 12 11/11/2016 0513   LDLCALC 60 01/04/2017 0752     Wt Readings from Last 3 Encounters:  03/12/18 179 lb 12.8 oz (81.6 kg)  03/06/17 180 lb 12.8 oz (82 kg)  11/25/16 183 lb 6.4 oz (83.2 kg)     Other studies Reviewed: Additional studies/ records that were reviewed today include: . Review of the above records demonstrates:   Assessment and Plan:   1. CAD without angina: Mild CAD by cath in 2018. Possible microvascular component to his chest pain. Will continue  Norvasc and ASA. He stopped his statin due to muscle aches. Will check lipids this week. He has been off of the statin for 10 weeks. Based on results of lipid profile, consider adding statin back at lower dose.   2. Thoracic aortic aneurysm: Mild dilation aortic root 3.9 cm by chest CTA June 2019.  Will repeat June 2021.   3. HTN: BP elevated. He did not tolerate amlodipine 10 mg daily. Will continue amlodipine 5 mg daily and add Lisinopril 5 mg daily. BMET this week prior to starting Lisinopril.   Current medicines are reviewed at length with the patient today.  The patient does not have concerns regarding medicines.  The following changes have been made:  no change  Labs/ tests ordered today include:   Orders Placed This Encounter  Procedures  . Comp Met (CMET)  . Lipid Profile  . EKG 12-Lead     Disposition:   FU with me in 12 months   Signed, Lauree Chandler, MD 03/12/2018 3:46 PM    [facility] [place], [place], [place]  [address] Phone: [phone]; Fax: [phone]

## 2018-03-14 ENCOUNTER — Other Ambulatory Visit: Payer: 59 | Admitting: *Deleted

## 2018-03-14 DIAGNOSIS — I10 Essential (primary) hypertension: Secondary | ICD-10-CM | POA: Diagnosis not present

## 2018-03-14 DIAGNOSIS — I2510 Atherosclerotic heart disease of native coronary artery without angina pectoris: Secondary | ICD-10-CM

## 2018-03-14 LAB — LIPID PANEL
CHOLESTEROL TOTAL: 186 mg/dL (ref 100–199)
Chol/HDL Ratio: 2.9 ratio (ref 0.0–5.0)
HDL: 64 mg/dL (ref >39)
LDL Calculated: 112 mg/dL — ABNORMAL HIGH (ref 0–99)
Triglycerides: 52 mg/dL (ref 0–149)
VLDL Cholesterol Cal: 10 mg/dL (ref 5–40)

## 2018-03-14 LAB — COMPREHENSIVE METABOLIC PANEL
ALBUMIN: 4.5 g/dL (ref 3.5–5.5)
ALK PHOS: 54 IU/L (ref 39–117)
ALT: 20 IU/L (ref 0–44)
AST: 20 IU/L (ref 0–40)
Albumin/Globulin Ratio: 2 (ref 1.2–2.2)
BILIRUBIN TOTAL: 0.8 mg/dL (ref 0.0–1.2)
BUN / CREAT RATIO: 14 (ref 9–20)
BUN: 15 mg/dL (ref 6–24)
CHLORIDE: 102 mmol/L (ref 96–106)
CO2: 25 mmol/L (ref 20–29)
Calcium: 9.5 mg/dL (ref 8.7–10.2)
Creatinine, Ser: 1.05 mg/dL (ref 0.76–1.27)
GFR calc Af Amer: 93 mL/min/{1.73_m2} (ref >59)
GFR calc non Af Amer: 81 mL/min/{1.73_m2} (ref >59)
GLOBULIN, TOTAL: 2.3 g/dL (ref 1.5–4.5)
GLUCOSE: 86 mg/dL (ref 65–99)
Potassium: 4.3 mmol/L (ref 3.5–5.2)
SODIUM: 142 mmol/L (ref 134–144)
Total Protein: 6.8 g/dL (ref 6.0–8.5)

## 2018-03-15 ENCOUNTER — Other Ambulatory Visit: Payer: Self-pay | Admitting: *Deleted

## 2018-03-15 DIAGNOSIS — E7849 Other hyperlipidemia: Secondary | ICD-10-CM

## 2018-03-15 MED ORDER — ROSUVASTATIN CALCIUM 10 MG PO TABS: 10.0000 mg | ORAL_TABLET | Freq: Every day | ORAL | 11 refills | Status: DC

## 2018-03-15 NOTE — Progress Notes (Signed)
Notes recorded by Dossie Arbour, RN on 03/15/2018 at 10:01 AM EDT Pt notified. Will send prescription to [facility] in [place]. He will come in for fasting lab work on 06/05/18 ------  Notes recorded by Kathleene Hazel, MD on 03/15/2018 at 9:30 AM EDT Renal function and LFTs are ok. LDL is up to 112 from 60 off of his Lipitor. Can we see if he would be willing to start Crestor 10 mg daily? Repeat lipids and LFTs in 12 weeks. Thanks, Zachary Raymond

## 2018-04-10 DIAGNOSIS — Z23 Encounter for immunization: Secondary | ICD-10-CM | POA: Diagnosis not present

## 2018-04-10 DIAGNOSIS — Z125 Encounter for screening for malignant neoplasm of prostate: Secondary | ICD-10-CM | POA: Diagnosis not present

## 2018-04-10 DIAGNOSIS — Z0000 Encounter for general adult medical examination without abnormal findings: Secondary | ICD-10-CM | POA: Diagnosis not present

## 2018-05-20 ENCOUNTER — Encounter

## 2018-06-05 ENCOUNTER — Other Ambulatory Visit: Payer: 59

## 2018-06-05 DIAGNOSIS — E7849 Other hyperlipidemia: Secondary | ICD-10-CM | POA: Diagnosis not present

## 2018-06-05 LAB — HEPATIC FUNCTION PANEL
ALK PHOS: 47 IU/L (ref 39–117)
ALT: 28 IU/L (ref 0–44)
AST: 21 IU/L (ref 0–40)
Albumin: 4.4 g/dL (ref 3.5–5.5)
Bilirubin Total: 1 mg/dL (ref 0.0–1.2)
Bilirubin, Direct: 0.27 mg/dL (ref 0.00–0.40)
Total Protein: 6.5 g/dL (ref 6.0–8.5)

## 2018-06-05 LAB — LIPID PANEL
Chol/HDL Ratio: 2.1 ratio (ref 0.0–5.0)
Cholesterol, Total: 125 mg/dL (ref 100–199)
HDL: 60 mg/dL (ref >39)
LDL Calculated: 57 mg/dL (ref 0–99)
Triglycerides: 42 mg/dL (ref 0–149)
VLDL Cholesterol Cal: 8 mg/dL (ref 5–40)

## 2018-07-07 IMAGING — CT CT ANGIO CHEST
2 of 7 series · 18 of 46 positions shown · IV contrast (APPLIED)
Comparison: Chest radiograph performed 11/10/2016

CLINICAL DATA: Question of enlarged aorta. Borderline high blood
pressure. Further evaluation requested. Initial encounter.

EXAM:
CT ANGIOGRAPHY CHEST WITH CONTRAST
TECHNIQUE: Multidetector CT imaging of the chest was performed using the
standard protocol during bolus administration of intravenous
contrast. Multiplanar CT image reconstructions and MIPs were
obtained to evaluate the vascular anatomy.
CONTRAST:  80 mL of Isovue 370 IV contrast

[Series 5: dissection 3.0 i30f 3 · axial · 0.86mm/px · z∈[+1103,+1382]mm · 15 of 101 slices shown]
[im 4/101  lung]
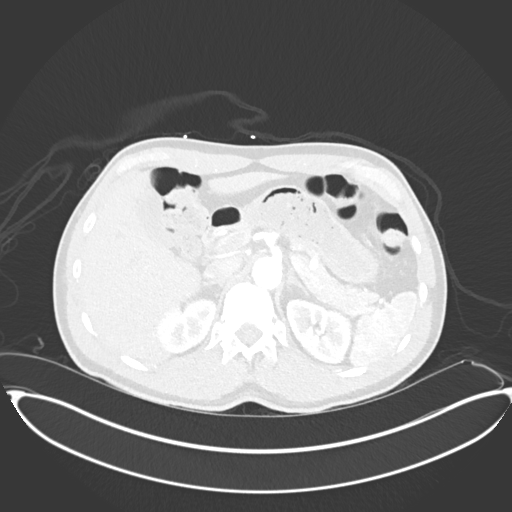
[im 11/101  soft-tissue]
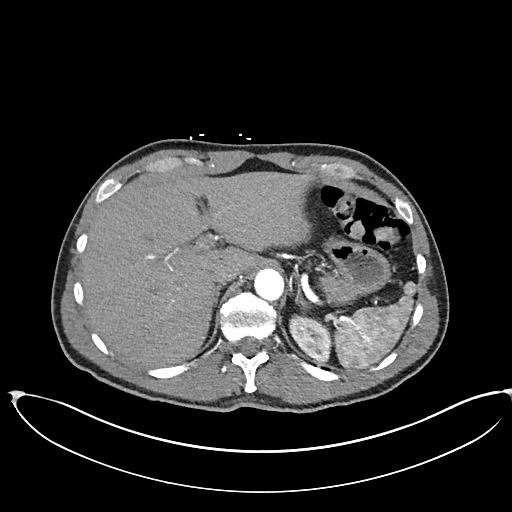
[im 18/101  lung]
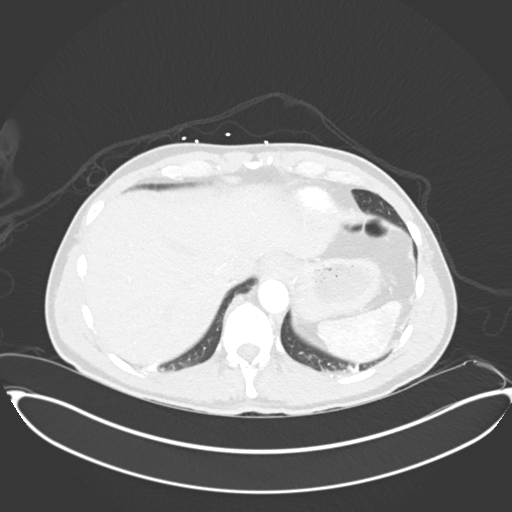
[im 26/101  soft-tissue]
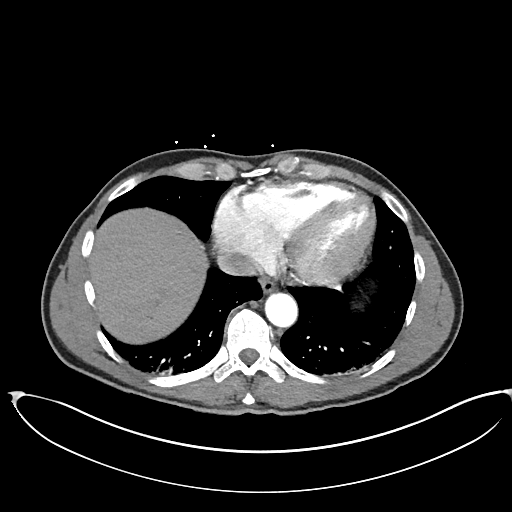
[im 33/101  lung]
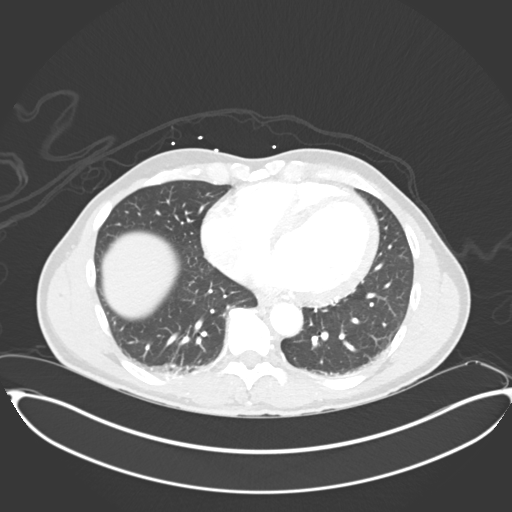
[im 36/101  soft-tissue]
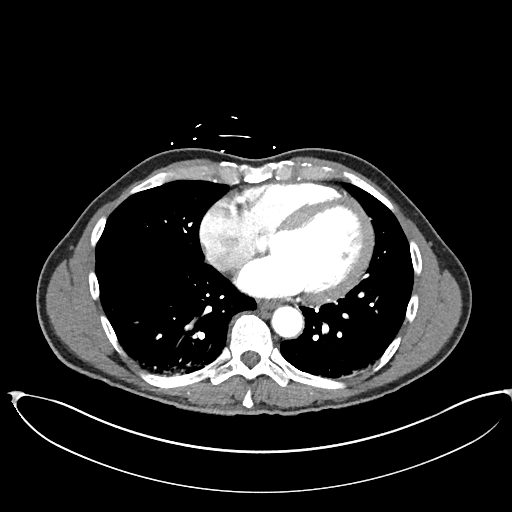
[im 43/101  lung]
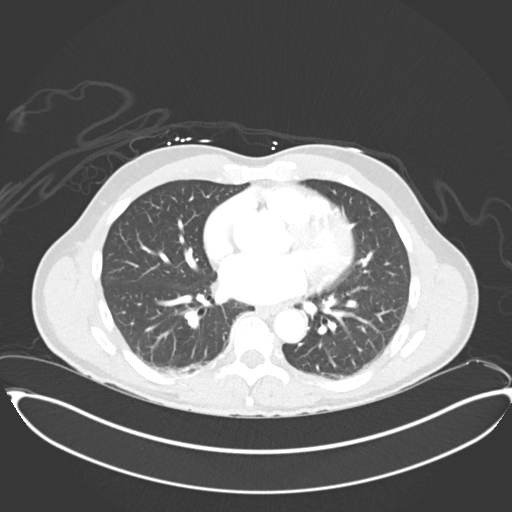
[im 51/101  soft-tissue]
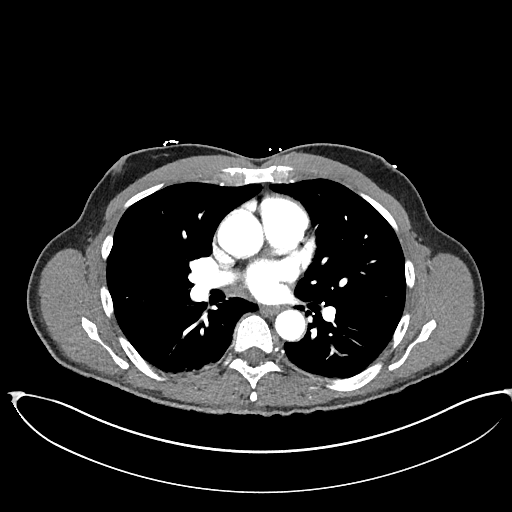
[im 58/101  lung]
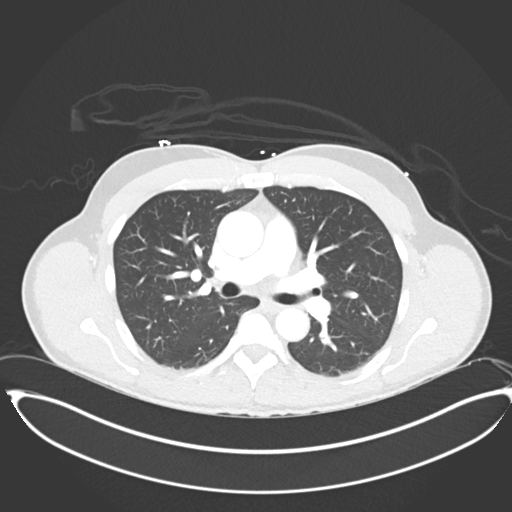
[im 65/101  soft-tissue]
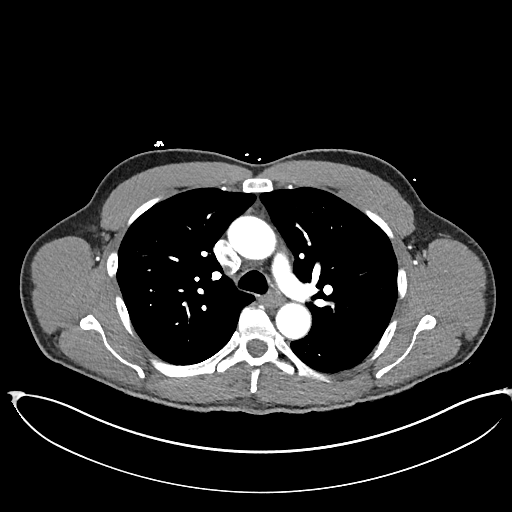
[im 68/101  lung]
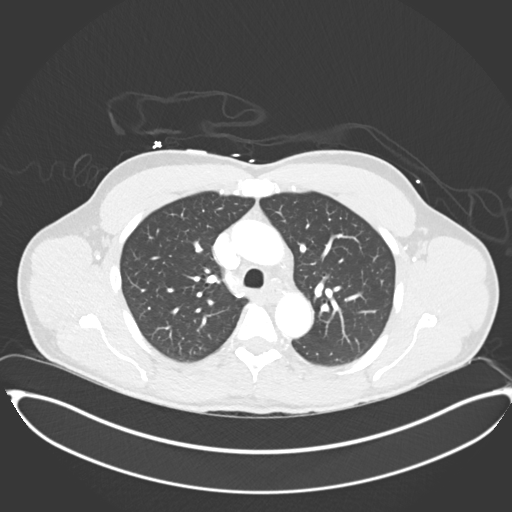
[im 76/101  soft-tissue]
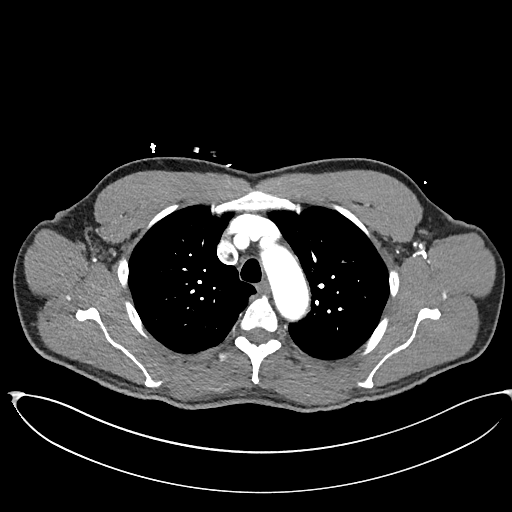
[im 83/101  lung]
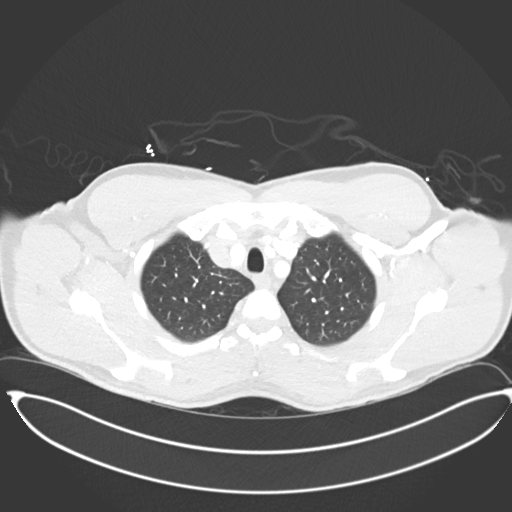
[im 90/101  soft-tissue]
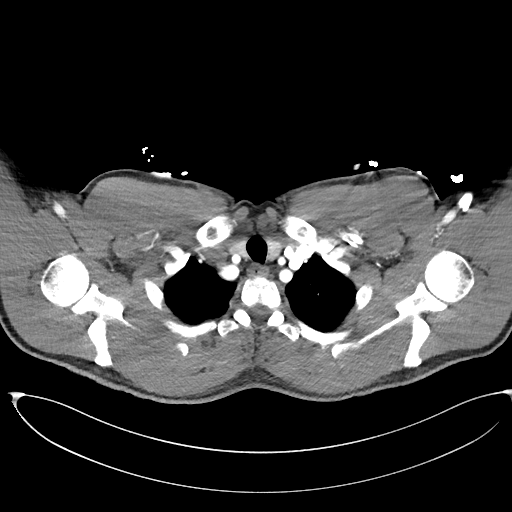
[im 97/101  lung]
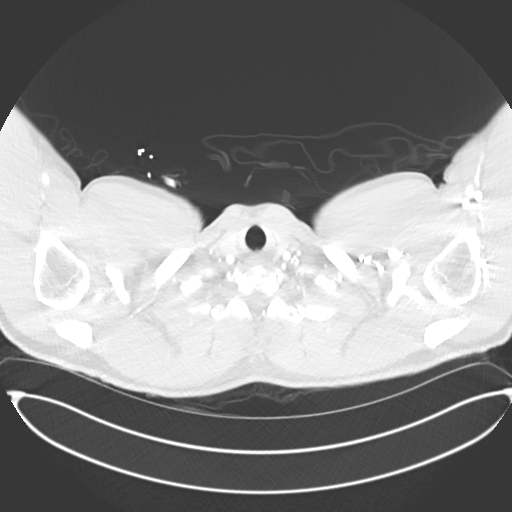

[Series 8: coronals · coronal · 0.60mm/px · 3 of 116 slices shown]
[im 29/116  soft-tissue]
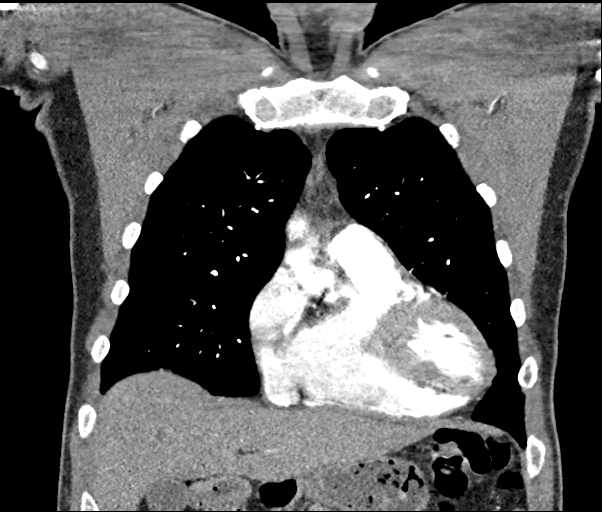
[im 58/116  soft-tissue]
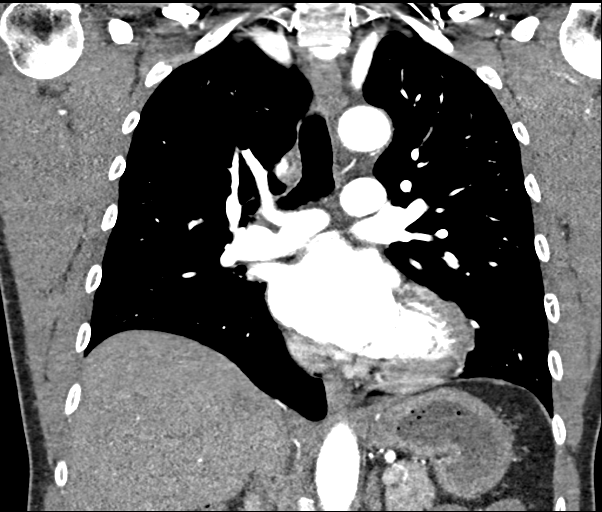
[im 87/116  soft-tissue]
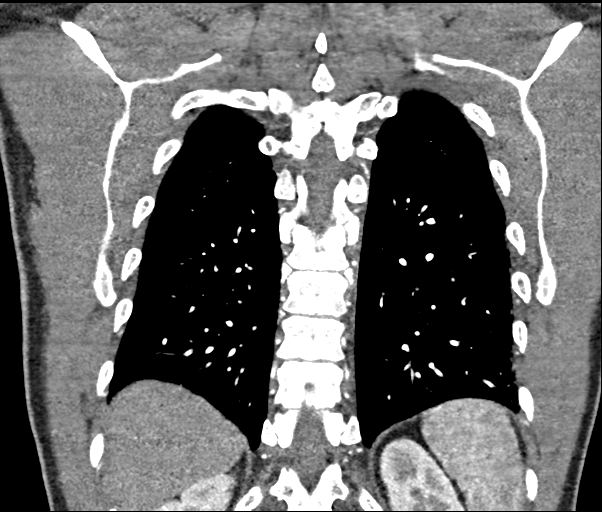

[18 of 46 positions shown; findings below may reference images not displayed]

FINDINGS: Cardiovascular: There is borderline aneurysmal dilatation of the
ascending thoracic aorta to 4.2 cm in AP dimension. There is no
additional evidence of aneurysmal dilatation. The aortic arch is
normal in caliber. No significant calcific atherosclerotic disease
is seen. The great vessels are unremarkable in appearance.

There is no evidence of aortic dissection. The heart is borderline
normal in size. There is no evidence of significant pulmonary
embolus.

Mediastinum/Nodes: The mediastinum is unremarkable in appearance. No
mediastinal lymphadenopathy is seen. No pericardial effusion is
identified. The visualized portions of the thyroid gland are
unremarkable. No axillary lymphadenopathy is seen.

Lungs/Pleura: Mild bibasilar atelectasis is noted. No pleural
effusion or pneumothorax is seen. No masses are identified.

Upper Abdomen: The visualized portions of the liver and spleen are
unremarkable. The visualized portions of the gallbladder are within
normal limits. The visualized portions of the pancreas, adrenal
glands and kidneys are within normal limits.

Musculoskeletal: No acute osseous abnormalities are identified. The
visualized musculature is unremarkable in appearance.

Review of the MIP images confirms the above findings.
IMPRESSION: 1. Borderline aneurysmal dilatation of the ascending thoracic aorta
to 4.2 cm in AP dimension. Recommend annual imaging followup by CTA
or MRA. This recommendation follows 7292
ACCF/AHA/AATS/ACR/ASA/SCA/EMBACHER/SABRINA/DENKLEY/AILUJ Guidelines for the
Diagnosis and Management of Patients with Thoracic Aortic Disease.
Circulation. 7292; 121: [id number]
2. No evidence of aortic dissection. No evidence of significant
pulmonary embolus. No significant calcific atherosclerotic disease
seen.
3. Mild bibasilar atelectasis noted.  Lungs otherwise clear.

## 2018-09-04 ENCOUNTER — Other Ambulatory Visit: Payer: Self-pay | Admitting: Cardiovascular Disease

## 2018-09-28 ENCOUNTER — Other Ambulatory Visit: Payer: Self-pay | Admitting: Cardiovascular Disease

## 2018-12-26 ENCOUNTER — Other Ambulatory Visit: Payer: Self-pay | Admitting: Cardiovascular Disease

## 2019-03-05 ENCOUNTER — Other Ambulatory Visit: Payer: Self-pay | Admitting: Cardiovascular Disease

## 2019-03-21 ENCOUNTER — Other Ambulatory Visit: Payer: Self-pay | Admitting: Cardiovascular Disease

## 2019-04-28 NOTE — Progress Notes (Signed)
Chief Complaint  Patient presents with  . Follow-up    thoracic aortic aneurysm without rupture    History of Present Illness: 53 yo male with history of HTN, CAD, thoracic aortic aneursym and GERD here today for follow up. He was admitted to [facility] May 2018 with chest pain and had negative cardiac markers. Cardiac cath on 11/11/16 with mild non-obstructive CAD (20% mid RCA stenosis, 20% OM1 stenosis). LVEF=55-65% by LV gram. Flow down the coronary arteries was sluggish suggesting possible microvascular disease. He was started on ASA, Norvasc and a statin. No beta blocker given bradycardia. CTA chest with mildly dilated ascending aorta (4.2 cm). Echo May 2018 with normal LV function, LVEF=55-60%, mild LVH, grade 1 diastolic dysfunction, LAE. No significant valve disease. CTA chest June 2019 with 3.9 cm ascending aortic aneurysm.   He is here today for follow up. The patient denies any chest pain, dyspnea, palpitations, lower extremity edema, orthopnea, PND, dizziness, near syncope or syncope.     Primary Care Physician: Blair Heys, MD  Past Medical History:  Diagnosis Date  . Borderline hypertension   . Chest pain, moderate coronary artery risk 04/24/2011  . Family history of adverse reaction to anesthesia    "my father used to be slow to wake up from it"  . GERD (gastroesophageal reflux disease)     Past Surgical History:  Procedure Laterality Date  . COLONOSCOPY  06/2016   "diverticulitis"  . LEFT HEART CATH AND CORONARY ANGIOGRAPHY N/A 11/11/2016   Procedure: Left Heart Cath and Coronary Angiography;  Surgeon: Kathleene Hazel, MD;  Location: [facility] INVASIVE CV LAB;  Service: Cardiovascular;  Laterality: N/A;    Current Outpatient Medications  Medication Sig Dispense Refill  . amLODipine (NORVASC) 5 MG tablet Take 1 tablet (5 mg total) by mouth daily. 90 tablet 0  . aspirin 81 MG chewable tablet Chew 1 tablet (81 mg total) by mouth daily. 30 tablet 0  . rosuvastatin  (CRESTOR) 10 MG tablet TAKE 1 TABLET BY MOUTH EVERY DAY 90 tablet 3  . lisinopril (ZESTRIL) 10 MG tablet Take 1 tablet (10 mg total) by mouth daily. 90 tablet 3   No current facility-administered medications for this visit.     Allergies  Allergen Reactions  . Penicillins Rash    Has patient had a PCN reaction causing immediate rash, facial/tongue/throat swelling, SOB or lightheadedness with hypotension: No Has patient had a PCN reaction causing severe rash involving mucus membranes or skin necrosis: No Has patient had a PCN reaction that required hospitalization: No Has patient had a PCN reaction occurring within the last 10 years: No If all of the above answers are "NO", then may proceed with Cephalosporin use.    Social History   Socioeconomic History  . Marital status: Married    Spouse name: Not on file  . Number of children: Not on file  . Years of education: Not on file  . Highest education level: Not on file  Occupational History  . Occupation: Estate manager/land agent: [employer]  Social Needs  . Financial resource strain: Not on file  . Food insecurity    Worry: Not on file    Inability: Not on file  . Transportation needs    Medical: Not on file    Non-medical: Not on file  Tobacco Use  . Smoking status: Never Smoker  . Smokeless tobacco: Never Used  Substance and Sexual Activity  . Alcohol use: Yes  Alcohol/week: 2.0 standard drinks    Types: 2 Cans of beer per week  . Drug use: No  . Sexual activity: Yes  Lifestyle  . Physical activity    Days per week: Not on file    Minutes per session: Not on file  . Stress: Not on file  Relationships  . Social Herbalist on phone: Not on file    Gets together: Not on file    Attends religious service: Not on file    Active member of club or organization: Not on file    Attends meetings of clubs or organizations: Not on file    Relationship status: Not on file  . Intimate partner violence     Fear of current or ex partner: Not on file    Emotionally abused: Not on file    Physically abused: Not on file    Forced sexual activity: Not on file  Other Topics Concern  . Not on file  Social History Narrative   Pt lives with wife near [place], [place].     Family History  Problem Relation Age of Onset  . Coronary artery disease Maternal Grandfather        CABG in 60's  . Sudden death Father 48       Farming accident (not cardiac)    Review of Systems:  As stated in the HPI and otherwise negative.   BP 130/80   Pulse (!) 55   Ht 5\' 9"  (1.753 m)   Wt 182 lb 12.8 oz (82.9 kg)   SpO2 96%   BMI 26.99 kg/m   Physical Examination: General: Well developed, well nourished, NAD  HEENT: OP clear, mucus membranes moist  SKIN: warm, dry. No rashes. Neuro: No focal deficits  Musculoskeletal: Muscle strength 5/5 all ext  Psychiatric: Mood and affect normal  Neck: No JVD, no carotid bruits, no thyromegaly, no lymphadenopathy.  Lungs:Clear bilaterally, no wheezes, rhonci, crackles Cardiovascular: Regular rate and rhythm. No murmurs, gallops or rubs. Abdomen:Soft. Bowel sounds present. Non-tender.  Extremities: No lower extremity edema. Pulses are 2 + in the bilateral DP/PT.  Echo 11/11/16: - Left ventricle: The cavity size was mildly dilated. Wall   thickness was increased in a pattern of mild LVH. Systolic   function was normal. The estimated ejection fraction was in the   range of 55% to 60%. Wall motion was normal; there were no   regional wall motion abnormalities. Doppler parameters are   consistent with abnormal left ventricular relaxation (grade 1   diastolic dysfunction). - Left atrium: The atrium was moderately dilated.  Cardiac cath 11/11/16: Left Circumflex  Vessel is large.  First Obtuse Marginal Branch  Vessel is large in size.  Ost 1st Mrg to 1st Mrg lesion, 20% stenosed.  Second Obtuse Marginal Branch  Vessel is large in size.  Third Obtuse Marginal Branch   Vessel is moderate in size.  Right Coronary Artery  Mid RCA lesion, 20% stenosed.  Wall Motion              Left Heart   Left Ventricle The left ventricular size is normal. The left ventricular systolic function is normal. LV end diastolic pressure is normal. The left ventricular ejection fraction is 55-65% by visual estimate. No regional wall motion abnormalities. There is no evidence of mitral regurgitation.    Coronary Diagrams   Diagnostic Diagram         CTA chest may 2018: 1. Borderline aneurysmal dilatation of the  ascending thoracic aorta to 4.2 cm in AP dimension. Recommend annual imaging followup by CTA or MRA. This recommendation follows 2010 ACCF/AHA/AATS/ACR/ASA/SCA/SCAI/SIR/STS/SVM Guidelines for the Diagnosis and Management of Patients with Thoracic Aortic Disease. Circulation. 2010; 121: [id number] 2. No evidence of aortic dissection. No evidence of significant pulmonary embolus. No significant calcific atherosclerotic disease seen. 3. Mild bibasilar atelectasis noted.  Lungs otherwise clear.  EKG:  EKG is ordered today. The ekg ordered today demonstrates Sinus brady, rate 55 bpm.   Recent Labs: 06/05/2018: ALT 28   Lipid Panel    Component Value Date/Time   CHOL 125 06/05/2018 0823   TRIG 42 06/05/2018 0823   HDL 60 06/05/2018 0823   CHOLHDL 2.1 06/05/2018 0823   CHOLHDL 2.9 11/11/2016 0513   VLDL 12 11/11/2016 0513   LDLCALC 57 06/05/2018 0823     Wt Readings from Last 3 Encounters:  04/29/19 182 lb 12.8 oz (82.9 kg)  03/12/18 179 lb 12.8 oz (81.6 kg)  03/06/17 180 lb 12.8 oz (82 kg)     Other studies Reviewed: Additional studies/ records that were reviewed today include: . Review of the above records demonstrates:   Assessment and Plan:   1. CAD without angina: Mild CAD by cath in 2018. Possible microvascular component to his chest pain. He has done well over the last year. Continue ASA, statin and Norvasc.   2. Thoracic aortic  aneurysm: Mild dilation aortic root 3.9 cm by chest CTA June 2019.  Repeat chest CTA June 2021.    3. HTN: BP is slightly elevated at home (135-145 systolic). Will increase Lisinopril to 10 mg daily. He will need a BMET in one week. He is asking if he can have this done in primary care.   4. Hyperlipidemia: Lipids followed in primary care. LDL at goal December 2019 in primary care. Continue statin.   Current medicines are reviewed at length with the patient today.  The patient does not have concerns regarding medicines.  The following changes have been made:  no change  Labs/ tests ordered today include:   Orders Placed This Encounter  Procedures  . CT ANGIO CHEST AORTA W &/OR WO CONTRAST  . EKG 12-Lead     Disposition:   FU with me in 12 months   Signed, Verne Carrowhristopher Virga Haltiwanger, MD 04/29/2019 10:04 AM    [address] [facility] [address], [place], [place]  [id number] Phone: [phone]; Fax: [phone]

## 2019-04-29 ENCOUNTER — Encounter: Payer: Self-pay | Admitting: Cardiovascular Disease

## 2019-04-29 ENCOUNTER — Ambulatory Visit: Payer: No Typology Code available for payment source | Admitting: Cardiovascular Disease

## 2019-04-29 ENCOUNTER — Other Ambulatory Visit: Payer: Self-pay

## 2019-04-29 VITALS — BP 130/80 | HR 55 | Ht 69.0 in | Wt 182.8 lb

## 2019-04-29 DIAGNOSIS — E7800 Pure hypercholesterolemia, unspecified: Secondary | ICD-10-CM

## 2019-04-29 DIAGNOSIS — I712 Thoracic aortic aneurysm, without rupture, unspecified: Secondary | ICD-10-CM

## 2019-04-29 DIAGNOSIS — I2510 Atherosclerotic heart disease of native coronary artery without angina pectoris: Secondary | ICD-10-CM

## 2019-04-29 DIAGNOSIS — I10 Essential (primary) hypertension: Secondary | ICD-10-CM

## 2019-04-29 MED ORDER — LISINOPRIL 10 MG PO TABS: 10.0000 mg | ORAL_TABLET | Freq: Every day | ORAL | 3 refills | Status: DC

## 2019-04-29 NOTE — Patient Instructions (Signed)
Medication Instructions:  Your physician has recommended you make the following change in your medication:  1.) increase lisinopril to 10 mg daily *If you need a refill on your cardiac medications before your next appointment, please call your pharmacy*  Lab Work: None (BMET to be drawn at PCP next week) If you have labs (blood work) drawn today and your tests are completely normal, you will receive your results only by: Marland Kitchen MyChart Message (if you have MyChart) OR . A paper copy in the mail If you have any lab test that is abnormal or we need to change your treatment, we will call you to review the results.  Testing/Procedures:  Non-Cardiac CT Angiography (CTA), is a special type of CT scan that uses a computer to produce multi-dimensional views of major blood vessels throughout the body. In CT angiography, a contrast material is injected through an IV to help visualize the blood vessels   Follow-Up: At [facility], you and your health needs are our priority.  As part of our continuing mission to provide you with exceptional heart care, we have created designated Provider Care Teams.  These Care Teams include your primary Cardiologist (physician) and Advanced Practice Providers (APPs -  Physician Assistants and Nurse Practitioners) who all work together to provide you with the care you need, when you need it.  Your next appointment:   12 months  The format for your next appointment:   In Person  Provider:   Lauree Chandler, MD  Other Instructions

## 2019-06-19 ENCOUNTER — Other Ambulatory Visit: Payer: Self-pay | Admitting: Cardiovascular Disease

## 2019-11-19 ENCOUNTER — Telehealth: Payer: Self-pay | Admitting: Cardiovascular Disease

## 2019-11-19 DIAGNOSIS — I2510 Atherosclerotic heart disease of native coronary artery without angina pectoris: Secondary | ICD-10-CM

## 2019-11-19 NOTE — Telephone Encounter (Signed)
New message     Patient is having a CT and needs to have a BMET on June 4th.  Can you please put the order in epic and schedule lab on 11-22-19.  Patient is aware of lab appt.   Thanks

## 2019-11-22 ENCOUNTER — Other Ambulatory Visit: Payer: Self-pay

## 2019-11-22 ENCOUNTER — Other Ambulatory Visit: Payer: No Typology Code available for payment source | Admitting: *Deleted

## 2019-11-22 DIAGNOSIS — I2510 Atherosclerotic heart disease of native coronary artery without angina pectoris: Secondary | ICD-10-CM

## 2019-11-22 LAB — BASIC METABOLIC PANEL
BUN/Creatinine Ratio: 14 (ref 9–20)
BUN: 15 mg/dL (ref 6–24)
CO2: 26 mmol/L (ref 20–29)
Calcium: 9.6 mg/dL (ref 8.7–10.2)
Chloride: 103 mmol/L (ref 96–106)
Creatinine, Ser: 1.11 mg/dL (ref 0.76–1.27)
GFR calc Af Amer: 86 mL/min/{1.73_m2} (ref >59)
GFR calc non Af Amer: 74 mL/min/{1.73_m2} (ref >59)
Glucose: 87 mg/dL (ref 65–99)
Potassium: 4.7 mmol/L (ref 3.5–5.2)
Sodium: 143 mmol/L (ref 134–144)

## 2019-11-27 ENCOUNTER — Ambulatory Visit (INDEPENDENT_AMBULATORY_CARE_PROVIDER_SITE_OTHER)
Admission: RE | Admit: 2019-11-27 | Discharge: 2019-11-27 | Disposition: A | Discharge status: Home / Self Care | Payer: No Typology Code available for payment source | Source: Ambulatory Visit | Attending: Cardiovascular Disease | Admitting: Cardiovascular Disease

## 2019-11-27 ENCOUNTER — Other Ambulatory Visit: Payer: Self-pay

## 2019-11-27 DIAGNOSIS — I712 Thoracic aortic aneurysm, without rupture, unspecified: Secondary | ICD-10-CM

## 2019-11-27 MED ORDER — IOHEXOL 350 MG/ML SOLN
75.0000 mL | Freq: Once | INTRAVENOUS | Status: AC | PRN
  Administered 2019-11-27: 75 mL via INTRAVENOUS

## 2019-12-11 ENCOUNTER — Other Ambulatory Visit: Payer: Self-pay | Admitting: Cardiovascular Disease

## 2020-02-29 ENCOUNTER — Other Ambulatory Visit: Payer: Self-pay | Admitting: Cardiovascular Disease

## 2020-03-09 ENCOUNTER — Telehealth: Payer: Self-pay | Admitting: *Deleted

## 2020-03-09 NOTE — Telephone Encounter (Signed)
° °  [facility] Pre-operative Risk Assessment    HEARTCARE STAFF: - Please ensure there is not already an duplicate clearance open for this procedure. - Under Visit Info/Reason for Call, type in Other and utilize the format Clearance MM/DD/YY or Clearance TBD. Do not use dashes or single digits. - If request is for dental extraction, please clarify the # of teeth to be extracted.  Request for surgical clearance:  1. What type of surgery is being performed? Bilateral upper eyelid blepharoplasty, bilateral direct browplasty (lateral)   2. When is this surgery scheduled? 03/25/2020   3. What type of clearance is required (medical clearance vs. Pharmacy clearance to hold med vs. Both)? Both  4. Are there any medications that need to be held prior to surgery and how long?Asa 81   5. Practice name and name of physician performing surgery? [facility]; Dr Sarajane Marek  6. What is the office phone number? [phone]   7.   What is the office fax number? [phone]  8.   Anesthesia type (None, local, MAC, general) ? MAC   Zachary Raymond L 03/09/2020, 8:44 AM  _________________________________________________________________   (provider comments below)

## 2020-03-09 NOTE — Telephone Encounter (Signed)
   Primary Cardiologist: Verne Carrow, MD  Chart reviewed as part of pre-operative protocol coverage. Patient was contacted 03/09/2020 in reference to pre-operative risk assessment for pending surgery as outlined below.  Zachary Raymond was last seen on 04/2019 by Dr. Clifton James with history of HTN, CAD, thoracic aortic aneursym and GERD.  He was admitted to [facility] May 2018 with chest pain and had negative cardiac markers. Cardiac cath on 11/11/16 with mild non-obstructive CAD (20% mid RCA stenosis, 20% OM1 stenosis). LVEF=55-65% by LV gram. Flow down the coronary arteries was sluggish suggesting possible microvascular disease vs effects of systemic NTG, treated medically. He also has mild ascending aortic aneurysm 3.9cm stable by CT 11/2019. I spoke with patient who affirms he is able to remain physically active without any new cardiac symptoms. No recent angina, dyspnea, palpitations, syncope. Therefore, based on ACC/AHA guidelines, the patient would be at acceptable risk for the planned procedure without further cardiovascular testing.   The patient was advised that if he develops new symptoms prior to surgery to contact our office to arrange for a follow-up visit, and he verbalized understanding.  He has no history of PCI, MI or CVA/TIA therefore OK to hold aspirin as requested, up to 7 days prior to procedure. We typically advise that blood thinners be resumed when felt safe by performing physician.  I will route this recommendation to the requesting party via Epic fax function and remove from pre-op pool. Please call with questions.  Laurann Montana, PA-C 03/09/2020, 12:24 PM

## 2020-04-24 ENCOUNTER — Telehealth: Payer: Self-pay

## 2020-04-24 NOTE — Telephone Encounter (Signed)
Spoke with patient who is agreeable to moving up his 07/24/20 appointment for his overdue f/u to 04/27/20 with Dr. Clifton James at 8:20am

## 2020-04-27 ENCOUNTER — Ambulatory Visit: Payer: No Typology Code available for payment source | Admitting: Cardiovascular Disease

## 2020-04-27 ENCOUNTER — Encounter: Payer: Self-pay | Admitting: Cardiovascular Disease

## 2020-04-27 ENCOUNTER — Other Ambulatory Visit: Payer: Self-pay

## 2020-04-27 VITALS — BP 124/76 | HR 60 | Ht 69.0 in | Wt 187.4 lb

## 2020-04-27 DIAGNOSIS — I10 Essential (primary) hypertension: Secondary | ICD-10-CM

## 2020-04-27 DIAGNOSIS — I712 Thoracic aortic aneurysm, without rupture, unspecified: Secondary | ICD-10-CM

## 2020-04-27 DIAGNOSIS — I2510 Atherosclerotic heart disease of native coronary artery without angina pectoris: Secondary | ICD-10-CM

## 2020-04-27 DIAGNOSIS — E7800 Pure hypercholesterolemia, unspecified: Secondary | ICD-10-CM | POA: Diagnosis not present

## 2020-04-27 MED ORDER — AMLODIPINE BESYLATE 5 MG PO TABS: 5.0000 mg | ORAL_TABLET | Freq: Every day | ORAL | 3 refills | Status: DC

## 2020-04-27 NOTE — Progress Notes (Signed)
Chief Complaint  Patient presents with  . Follow-up    CAD   History of Present Illness: 54 yo male with history of HTN, mild CAD, thoracic aortic aneursym and GERD here today for follow up. He was admitted to [facility] May 2018 with chest pain and had negative cardiac markers. Cardiac cath on 11/11/16 with mild non-obstructive CAD (20% mid RCA stenosis, 20% OM1 stenosis). LVEF=55-65% by LV gram. Flow down the coronary arteries was sluggish suggesting possible microvascular disease. He was started on ASA, Norvasc and a statin. No beta blocker given bradycardia. CTA chest with mildly dilated ascending aorta (4.2 cm). Echo May 2018 with normal LV function, LVEF=55-60%, mild LVH, grade 1 diastolic dysfunction, LAE. No significant valve disease. CTA chest June 2021 with stable 3.9 cm ascending aortic aneurysm.   He is here today for follow up. The patient denies any chest pain, dyspnea, palpitations, lower extremity edema, orthopnea, PND, dizziness, near syncope or syncope.   Primary Care Physician: Blair Heys, MD  Past Medical History:  Diagnosis Date  . Borderline hypertension   . Chest pain, moderate coronary artery risk 04/24/2011  . Family history of adverse reaction to anesthesia    "my father used to be slow to wake up from it"  . GERD (gastroesophageal reflux disease)     Past Surgical History:  Procedure Laterality Date  . COLONOSCOPY  06/2016   "diverticulitis"  . LEFT HEART CATH AND CORONARY ANGIOGRAPHY N/A 11/11/2016   Procedure: Left Heart Cath and Coronary Angiography;  Surgeon: Kathleene Hazel, MD;  Location: [facility] INVASIVE CV LAB;  Service: Cardiovascular;  Laterality: N/A;    Current Outpatient Medications  Medication Sig Dispense Refill  . amLODipine (NORVASC) 5 MG tablet Take 1 tablet (5 mg total) by mouth daily. 90 tablet 3  . aspirin 81 MG chewable tablet Chew 1 tablet (81 mg total) by mouth daily. 30 tablet 0  . lisinopril (ZESTRIL) 10 MG tablet TAKE 1 TABLET  BY MOUTH EVERY DAY 30 tablet 1  . Multiple Vitamins-Minerals (ONE-A-DAY MENS 50+ PO) Take by mouth daily.    . rosuvastatin (CRESTOR) 10 MG tablet TAKE 1 TABLET BY MOUTH EVERY DAY 90 tablet 3   No current facility-administered medications for this visit.    Allergies  Allergen Reactions  . Penicillins Rash    Has patient had a PCN reaction causing immediate rash, facial/tongue/throat swelling, SOB or lightheadedness with hypotension: No Has patient had a PCN reaction causing severe rash involving mucus membranes or skin necrosis: No Has patient had a PCN reaction that required hospitalization: No Has patient had a PCN reaction occurring within the last 10 years: No If all of the above answers are "NO", then may proceed with Cephalosporin use.    Social History   Socioeconomic History  . Marital status: Married    Spouse name: Not on file  . Number of children: Not on file  . Years of education: Not on file  . Highest education level: Not on file  Occupational History  . Occupation: Estate manager/land agent: [employer]  Tobacco Use  . Smoking status: Never Smoker  . Smokeless tobacco: Never Used  Vaping Use  . Vaping Use: Never used  Substance and Sexual Activity  . Alcohol use: Yes    Alcohol/week: 2.0 standard drinks    Types: 2 Cans of beer per week  . Drug use: No  . Sexual activity: Yes  Other Topics Concern  . Not on  file  Social History Narrative   Pt lives with wife near [place], [place].    Social Determinants of Health   Financial Resource Strain:   . Difficulty of Paying Living Expenses: Not on file  Food Insecurity:   . Worried About Programme researcher, broadcasting/film/video in the Last Year: Not on file  . Ran Out of Food in the Last Year: Not on file  Transportation Needs:   . Lack of Transportation (Medical): Not on file  . Lack of Transportation (Non-Medical): Not on file  Physical Activity:   . Days of Exercise per Week: Not on file  . Minutes of Exercise per  Session: Not on file  Stress:   . Feeling of Stress : Not on file  Social Connections:   . Frequency of Communication with Friends and Family: Not on file  . Frequency of Social Gatherings with Friends and Family: Not on file  . Attends Religious Services: Not on file  . Active Member of Clubs or Organizations: Not on file  . Attends Banker Meetings: Not on file  . Marital Status: Not on file  Intimate Partner Violence:   . Fear of Current or Ex-Partner: Not on file  . Emotionally Abused: Not on file  . Physically Abused: Not on file  . Sexually Abused: Not on file    Family History  Problem Relation Age of Onset  . Coronary artery disease Maternal Grandfather        CABG in 60's  . Sudden death Father 98       Farming accident (not cardiac)    Review of Systems:  As stated in the HPI and otherwise negative.   BP 124/76   Pulse 60   Ht 5\' 9"  (1.753 m)   Wt 187 lb 6.4 oz (85 kg)   SpO2 97%   BMI 27.67 kg/m   Physical Examination: General: Well developed, well nourished, NAD  HEENT: OP clear, mucus membranes moist  SKIN: warm, dry. No rashes. Neuro: No focal deficits  Musculoskeletal: Muscle strength 5/5 all ext  Psychiatric: Mood and affect normal  Neck: No JVD, no carotid bruits, no thyromegaly, no lymphadenopathy.  Lungs:Clear bilaterally, no wheezes, rhonci, crackles Cardiovascular: Regular rate and rhythm. No murmurs, gallops or rubs. Abdomen:Soft. Bowel sounds present. Non-tender.  Extremities: No lower extremity edema. Pulses are 2 + in the bilateral DP/PT.  Echo 11/11/16: - Left ventricle: The cavity size was mildly dilated. Wall   thickness was increased in a pattern of mild LVH. Systolic   function was normal. The estimated ejection fraction was in the   range of 55% to 60%. Wall motion was normal; there were no   regional wall motion abnormalities. Doppler parameters are   consistent with abnormal left ventricular relaxation (grade 1    diastolic dysfunction). - Left atrium: The atrium was moderately dilated.  Cardiac cath 11/11/16: Left Circumflex  Vessel is large.  First Obtuse Marginal Branch  Vessel is large in size.  Ost 1st Mrg to 1st Mrg lesion, 20% stenosed.  Second Obtuse Marginal Branch  Vessel is large in size.  Third Obtuse Marginal Branch  Vessel is moderate in size.  Right Coronary Artery  Mid RCA lesion, 20% stenosed.  Wall Motion              Left Heart   Left Ventricle The left ventricular size is normal. The left ventricular systolic function is normal. LV end diastolic pressure is normal. The left ventricular ejection fraction  is 55-65% by visual estimate. No regional wall motion abnormalities. There is no evidence of mitral regurgitation.    Coronary Diagrams   Diagnostic Diagram         CTA chest may 2018: 1. 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 Recommend annual imaging followup by CTA or MRA. This recommendation follows 2010 ACCF/AHA/AATS/ACR/ASA/SCA/SCAI/SIR/STS/SVM Guidelines for the Diagnosis and Management of Patients with Thoracic Aortic Disease. Circulation. 2010; 121: Y195-K932 2. No evidence of aortic dissection. No evidence of significant pulmonary embolus. No significant calcific atherosclerotic disease seen. 3. Mild bibasilar atelectasis noted.  Lungs otherwise clear.  EKG:  EKG is ordered today. The ekg ordered today demonstrates Sinus brady, rate 58 bpm  Recent Labs: 11/22/2019: BUN 15; Creatinine, Ser 1.11; Potassium 4.7; Sodium 143   Lipid Panel    Component Value Date/Time   CHOL 125 06/05/2018 0823   TRIG 42 06/05/2018 0823   HDL 60 06/05/2018 0823   CHOLHDL 2.1 06/05/2018 0823   CHOLHDL 2.9 11/11/2016 0513   VLDL 12 11/11/2016 0513   LDLCALC 57 06/05/2018 0823     Wt Readings from Last 3 Encounters:  04/27/20 187 lb 6.4 oz (85 kg)  04/29/19 182 lb 12.8 oz (82.9 kg)  03/12/18 179 lb 12.8 oz  (81.6 kg)     Other studies Reviewed: Additional studies/ records that were reviewed today include: . Review of the above records demonstrates:   Assessment and Plan:   1. CAD without angina: Mild CAD by cath in 2018. Possible microvascular component to his chest pain. He has done well over the last year. He has no chest pain. Will continue ASA, statin and Norvasc.    2. Thoracic aortic aneurysm: Mild dilation aortic root that has been unchanged for the last three years. 3.9 cm by chest CTA June 2021. Repeat June 2023.   3. HTN: BP is controlled.   4. Hyperlipidemia: Lipids followed in primary care. LDL 65. Will continue statin.    Current medicines are reviewed at length with the patient today.  The patient does not have concerns regarding medicines.  The following changes have been made:  no change  Labs/ tests ordered today include:   Orders Placed This Encounter  Procedures  . EKG 12-Lead     Disposition:   FU with me in 12 months   Signed, Verne Carrow, MD 04/27/2020 9:03 AM    [facility] [address] [place], [place], [place]  [address] Phone: [phone]; Fax: [phone]

## 2020-04-27 NOTE — Patient Instructions (Signed)
Medication Instructions:  No changes today *If you need a refill on your cardiac medications before your next appointment, please call your pharmacy*   Lab Work: none If you have labs (blood work) drawn today and your tests are completely normal, you will receive your results only by: Marland Kitchen MyChart Message (if you have MyChart) OR . A paper copy in the mail If you have any lab test that is abnormal or we need to change your treatment, we will call you to review the results.   Testing/Procedures: none   Follow-Up: At [facility], you and your health needs are our priority.  As part of our continuing mission to provide you with exceptional heart care, we have created designated Provider Care Teams.  These Care Teams include your primary Cardiologist (physician) and Advanced Practice Providers (APPs -  Physician Assistants and Nurse Practitioners) who all work together to provide you with the care you need, when you need it.    Your next appointment:   12 month(s)  The format for your next appointment:   In Person  Provider:   Verne Carrow, MD   Other Instructions

## 2020-05-18 MED ORDER — LISINOPRIL 10 MG PO TABS
10.0000 mg | ORAL_TABLET | Freq: Every day | ORAL | 3 refills | Status: DC
Start: 2020-05-18 — End: 2021-05-10

## 2020-07-24 ENCOUNTER — Ambulatory Visit: Payer: No Typology Code available for payment source | Admitting: Cardiovascular Disease

## 2020-08-17 ENCOUNTER — Ambulatory Visit: Payer: No Typology Code available for payment source | Admitting: Cardiovascular Disease

## 2020-09-22 ENCOUNTER — Ambulatory Visit: Payer: Self-pay | Admitting: Surgery

## 2020-09-22 DIAGNOSIS — I2510 Atherosclerotic heart disease of native coronary artery without angina pectoris: Secondary | ICD-10-CM

## 2020-09-22 NOTE — H&P (Signed)
Zachary Raymond Appointment: 09/22/2020 10:15 AM Location: [facility] Patient #: [id number] DOB: 03/08/1966 Married / Language: [place] / Race: White Male  History of Present Illness Ardeth Sportsman MD; 09/22/2020 10:44 AM) The patient is a 55 year old male who presents with an inguinal hernia. Note for "Inguinal hernia": ` ` ` Patient sent for surgical consultation at the request of Dr Dion Body  Chief Complaint: RIH ` ` The patient is an active male who has been working the gym to try and get in better shape. He noticed a right groin bulge. Got larger. Concerning. Saw his primary care physician. Right inguinal hernia suspected. Surgical consultation offered. Patient comes a with his wife. He noticed a bulge last month. It is sensitive he sits for prolonged period of time. So the some moderate activity in the gym. He can walk several miles without difficulty. Had colonoscopy 3 years ago that was underwhelming. Pulses bowels twice a day. Does not smoke. No diabetes. No abdominal surgery. No sleep apnea. Had some vague chest discomfort. Catheterization showed some very mild coronary disease not obstructing. Was on Plavix for a short period been noticed on aspirin. He is on blood pressure and anti-cholesterol medications. Otherwise in good shape  (Review of systems as stated in this history (HPI) or in the review of systems. Otherwise all other 12 point ROS are negative) ` ` ###########################################`  This patient encounter took 30 minutes today to perform the following: obtain history, perform exam, review outside records, interpret tests & imaging, counsel the patient on their diagnosis; and, document this encounter, including findings & plan in the electronic health record (EHR).   Past Surgical History Ethlyn Gallery, CMA; 09/22/2020 10:16 AM) Vasectomy  Diagnostic Studies History Ethlyn Gallery, CMA; 09/22/2020 10:16 AM) Colonoscopy 1-5  years ago  Allergies Elease Hashimoto Spillers, CMA; 09/22/2020 10:17 AM) Penicillins Rash.  Medication History (Alisha Spillers, CMA; 09/22/2020 10:17 AM) amLODIPine Besylate (5MG  Tablet, Oral) Active. Lisinopril (10MG  Tablet, Oral) Active. Rosuvastatin Calcium (10MG  Tablet, Oral) Active. Tadalafil (5MG  Tablet, Oral) Active. Baby Aspirin (81MG  Tablet Chewable, Oral) Active. Medications Reconciled  Social History , CMA; 09/22/2020 10:16 AM) Alcohol use Occasional alcohol use. Caffeine use Coffee, Tea. No drug use Tobacco use Never smoker.  Family History , CMA; 09/22/2020 10:16 AM) Heart disease in male family member before age 22  Other Problems Ethlyn Gallery, CMA; 09/22/2020 10:16 AM) High blood pressure Hypercholesterolemia     Review of Systems (Alisha Spillers CMA; 09/22/2020 10:16 AM) General Not Present- Appetite Loss, Chills, Fatigue, Fever, Night Sweats, Weight Gain and Weight Loss. Skin Not Present- Change in Wart/Mole, Dryness, Hives, Jaundice, New Lesions, Non-Healing Wounds, Rash and Ulcer. HEENT Present- Hearing Loss, Ringing in the Ears and Wears glasses/contact lenses. Not Present- Earache, Hoarseness, Nose Bleed, Oral Ulcers, Seasonal Allergies, Sinus Pain, Sore Throat, Visual Disturbances and Yellow Eyes. Respiratory Not Present- Bloody sputum, Chronic Cough, Difficulty Breathing, Snoring and Wheezing. Breast Not Present- Breast Mass, Breast Pain, Nipple Discharge and Skin Changes. Cardiovascular Not Present- Chest Pain, Difficulty Breathing Lying Down, Leg Cramps, Palpitations, Rapid Heart Rate, Shortness of Breath and Swelling of Extremities. Gastrointestinal Not Present- Abdominal Pain, Bloating, Bloody Stool, Change in Bowel Habits, Chronic diarrhea, Constipation, Difficulty Swallowing, Excessive gas, Gets full quickly at meals, Hemorrhoids, Indigestion, Nausea, Rectal Pain and Vomiting. Male Genitourinary Present- Frequency and  Urine Leakage. Not Present- Blood in Urine, Change in Urinary Stream, Impotence, Nocturia, Painful Urination and Urgency. Musculoskeletal Not Present- Back Pain, Joint Pain, Joint Stiffness, Muscle  Pain, Muscle Weakness and Swelling of Extremities. Neurological Not Present- Decreased Memory, Fainting, Headaches, Numbness, Seizures, Tingling, Tremor, Trouble walking and Weakness. Psychiatric Not Present- Anxiety, Bipolar, Change in Sleep Pattern, Depression, Fearful and Frequent crying. Endocrine Not Present- Cold Intolerance, Excessive Hunger, Hair Changes, Heat Intolerance, Hot flashes and New Diabetes. Hematology Not Present- Blood Thinners, Easy Bruising, Excessive bleeding, Gland problems, HIV and Persistent Infections.  Vitals (Alisha Spillers CMA; 09/22/2020 10:16 AM) 09/22/2020 10:16 AM Weight: 187 lb Height: 69in Body Surface Area: 2.01 m Body Mass Index: 27.61 kg/m  Pulse: 69 (Regular)  BP: 130/72(Sitting, Left Arm, Standard)        Physical Exam Ardeth Sportsman MD; 09/22/2020 10:36 AM)  General Mental Status-Alert. General Appearance-Not in acute distress, Not Sickly. Orientation-Oriented X3. Hydration-Well hydrated. Voice-Normal.  Integumentary Global Assessment Upon inspection and palpation of skin surfaces of the - Axillae: non-tender, no inflammation or ulceration, no drainage. and Distribution of scalp and body hair is normal. General Characteristics Temperature - normal warmth is noted.  Head and Neck Head-normocephalic, atraumatic with no lesions or palpable masses. Face Global Assessment - atraumatic, no absence of expression. Neck Global Assessment - no abnormal movements, no bruit auscultated on the right, no bruit auscultated on the left, no decreased range of motion, non-tender. Trachea-midline. Thyroid Gland Characteristics - non-tender.  Eye Eyeball - Left-Extraocular movements intact, No Nystagmus - Left. Eyeball -  Right-Extraocular movements intact, No Nystagmus - Right. Cornea - Left-No Hazy - Left. Cornea - Right-No Hazy - Right. Sclera/Conjunctiva - Left-No scleral icterus, No Discharge - Left. Sclera/Conjunctiva - Right-No scleral icterus, No Discharge - Right. Pupil - Left-Direct reaction to light normal. Pupil - Right-Direct reaction to light normal.  ENMT Ears Pinna - Left - no drainage observed, no generalized tenderness observed. Pinna - Right - no drainage observed, no generalized tenderness observed. Nose and Sinuses External Inspection of the Nose - no destructive lesion observed. Inspection of the nares - Left - quiet respiration. Inspection of the nares - Right - quiet respiration. Mouth and Throat Lips - Upper Lip - no fissures observed, no pallor noted. Lower Lip - no fissures observed, no pallor noted. Nasopharynx - no discharge present. Oral Cavity/Oropharynx - Tongue - no dryness observed. Oral Mucosa - no cyanosis observed. Hypopharynx - no evidence of airway distress observed.  Chest and Lung Exam Inspection Movements - Normal and Symmetrical. Accessory muscles - No use of accessory muscles in breathing. Palpation Palpation of the chest reveals - Non-tender. Auscultation Breath sounds - Normal and Clear.  Cardiovascular Auscultation Rhythm - Regular. Murmurs & Other Heart Sounds - Auscultation of the heart reveals - No Murmurs and No Systolic Clicks.  Abdomen Inspection Inspection of the abdomen reveals - No Visible peristalsis and No Abnormal pulsations. Umbilicus - No Bleeding, No Urine drainage. Palpation/Percussion Palpation and Percussion of the abdomen reveal - Soft, Non Tender, No Rebound tenderness, No Rigidity (guarding) and No Cutaneous hyperesthesia. Note: Abdomen soft. Nontender. Not distended. Small periumbilical mass reduces down consistent with small umbilical hernia. No other abdominal wall hernias. No guarding.  Male  Genitourinary Sexual Maturity Tanner 5 - Adult hair pattern and Adult penile size and shape. Note: Right groin swelling consistent with reducible inguinal hernia. Mild impulse on the left side. Otherwise normal external male genitalia.  Peripheral Vascular Upper Extremity Inspection - Left - No Cyanotic nailbeds - Left, Not Ischemic. Inspection - Right - No Cyanotic nailbeds - Right, Not Ischemic.  Neurologic Neurologic evaluation reveals -normal attention span and ability  to concentrate, able to name objects and repeat phrases. Appropriate fund of knowledge , normal sensation and normal coordination. Mental Status Affect - not angry, not paranoid. Cranial Nerves-Normal Bilaterally. Gait-Normal.  Neuropsychiatric Mental status exam performed with findings of-able to articulate well with normal speech/language, rate, volume and coherence, thought content normal with ability to perform basic computations and apply abstract reasoning and no evidence of hallucinations, delusions, obsessions or homicidal/suicidal ideation.  Musculoskeletal Global Assessment Spine, Ribs and Pelvis - no instability, subluxation or laxity. Right Upper Extremity - no instability, subluxation or laxity.  Lymphatic Head & Neck  General Head & Neck Lymphatics: Bilateral - Description - No Localized lymphadenopathy. Axillary  General Axillary Region: Bilateral - Description - No Localized lymphadenopathy. Femoral & Inguinal  Generalized Femoral & Inguinal Lymphatics: Left - Description - No Localized lymphadenopathy. Right - Description - No Localized lymphadenopathy.    Assessment & Plan Ardeth Sportsman MD; 09/22/2020 10:38 AM)  RIGHT INGUINAL HERNIA (K40.90) Impression: Obvious right inguinal hernia. Mild sensitivity but no definite hernia on the left.  I recommended laparoscopic exploration & repair of hernias found. Definite right inguinal. Low threshold to fix contralateral side if there is  one present. Umbilical hernia small enough that just suture repair should be all that is needed. He and his wife like that plan.  He is pondering delaying until the fall but his wife wants to try and get it done as soon as possible. I will let them sort out a convenient time for him.   UMBILICAL HERNIA WITHOUT OBSTRUCTION AND WITHOUT GANGRENE (K42.9) Impression: Small umbilical hernia through the stalk. Plan primary repair   PREOP - ING HERNIA - ENCOUNTER FOR PREOPERATIVE EXAMINATION FOR GENERAL SURGICAL PROCEDURE (Z01.818)  Current Plans You are being scheduled for surgery- Our schedulers will call you.  You should hear from our office's scheduling department within 5 working days about the location, date, and time of surgery. We try to make accommodations for patient's preferences in scheduling surgery, but sometimes the OR schedule or the surgeon's schedule prevents [place] from making those accommodations.  If you have not heard from our office [phone]) in 5 working days, call the office and ask for your surgeon's nurse.  If you have other questions about your diagnosis, plan, or surgery, call the office and ask for your surgeon's nurse.  Written instructions provided The anatomy & physiology of the abdominal wall and pelvic floor was discussed. The pathophysiology of hernias in the inguinal and pelvic region was discussed. Natural history risks such as progressive enlargement, pain, incarceration, and strangulation was discussed. Contributors to complications such as smoking, obesity, diabetes, prior surgery, etc were discussed.  I feel the risks of no intervention will lead to serious problems that outweigh the operative risks; therefore, I recommended surgery to reduce and repair the hernia. I explained laparoscopic techniques with possible need for an open approach. I noted usual use of mesh to patch and/or buttress hernia repair  Risks such as bleeding, infection,  abscess, need for further treatment, heart attack, death, and other risks were discussed. I noted a good likelihood this will help address the problem. Goals of post-operative recovery were discussed as well. Possibility that this will not correct all symptoms was explained. I stressed the importance of low-impact activity, aggressive pain control, avoiding constipation, & not pushing through pain to minimize risk of post-operative chronic pain or injury. Possibility of reherniation was discussed. We will work to minimize complications.  An educational handout further explaining the  pathology & treatment options was given as well. Questions were answered. The patient expresses understanding & wishes to proceed with surgery.  Pt Education - Pamphlet Given - Laparoscopic Hernia Repair: discussed with patient and provided information. Pt Education - [facility] Hernia Post-Op HCI (Khylon Davies): discussed with patient and provided information. Pt Education - [facility] Mesh education: discussed with patient and provided information.  CORONARY ARTERY DISEASE WITHOUT ANGINA PECTORIS (I25.10) Impression: Very mild coronary disease on catheterization 4 years ago. Not obstructing. Just on aspirin. Excellent performance status. I do not think he needs more aggressive cardiac clearance.  Ardeth SportsmanSteven C. Daylin Gruszka, MD, FACS, MASCRS  Gastrointestinal and Minimally Invasive Surgery  [facility] [address] [place], [place] [id number] [phone] Fax [phone] Main/Paging  CONTACT INFORMATION: Weekday (9AM-5PM) concerns: Call [facility] main office at [phone] Weeknight (5PM-9AM) or Weekend/Holiday concerns: Check [email or web address] for General Surgery [facility] coverage (Please, do not use SecureChat as it is not reliable communication to operating surgeons for immediate patient care)

## 2020-12-14 ENCOUNTER — Other Ambulatory Visit: Payer: Self-pay | Admitting: Cardiovascular Disease

## 2021-01-29 ENCOUNTER — Telehealth: Payer: Self-pay | Admitting: Cardiovascular Disease

## 2021-01-29 NOTE — Telephone Encounter (Signed)
Pt c/o medication issue:  1. Name of Medication: Tramadol   2. How are you currently taking this medication (dosage and times per day)? Not sure yet, pt is still at the surgery center   3. Are you having a reaction (difficulty breathing--STAT)?  No  4. What is your medication issue? Pts wife is stating that her husband just got out of surgery and will be given tramadol for pain... she would like to know if pt will be able to take Tylenol or Advil in addition to Tramadol for pain as Tramadol alone will not be enough

## 2021-01-29 NOTE — Telephone Encounter (Signed)
Patient's wife reports pt just had hernia repair surgery and was prescribed Tramadol and told to use OTC pain relievers in between.  She said tylenol doesn't really cut it when you have surgery.  We discussed that for heart patients we advise against Advil as it can possibly increase BP and bleeding risk.  And should be cautious in patient's w renal impairment.  Last labs in epic creatinine was nl. He has been off asa and starting back tomorrow.  She plans to have him [place] advil conservatively for a couple days in between tramadol and then will switch to advil.

## 2021-05-10 ENCOUNTER — Other Ambulatory Visit: Payer: Self-pay | Admitting: Cardiovascular Disease

## 2021-06-18 ENCOUNTER — Other Ambulatory Visit: Payer: Self-pay | Admitting: Cardiovascular Disease

## 2021-07-07 ENCOUNTER — Other Ambulatory Visit: Payer: Self-pay

## 2021-07-07 ENCOUNTER — Ambulatory Visit (INDEPENDENT_AMBULATORY_CARE_PROVIDER_SITE_OTHER): Payer: No Typology Code available for payment source | Admitting: Cardiovascular Disease

## 2021-07-07 ENCOUNTER — Encounter: Payer: Self-pay | Admitting: Cardiovascular Disease

## 2021-07-07 VITALS — BP 152/78 | HR 63 | Ht 69.0 in | Wt 186.8 lb

## 2021-07-07 DIAGNOSIS — I10 Essential (primary) hypertension: Secondary | ICD-10-CM | POA: Diagnosis not present

## 2021-07-07 DIAGNOSIS — E7800 Pure hypercholesterolemia, unspecified: Secondary | ICD-10-CM

## 2021-07-07 DIAGNOSIS — I2510 Atherosclerotic heart disease of native coronary artery without angina pectoris: Secondary | ICD-10-CM

## 2021-07-07 DIAGNOSIS — I712 Thoracic aortic aneurysm, without rupture, unspecified: Secondary | ICD-10-CM

## 2021-07-07 NOTE — Patient Instructions (Addendum)
Medication Instructions:  No changes *If you need a refill on your cardiac medications before your next appointment, please call your pharmacy*   Lab Work: Today: BMET   Testing/Procedures: Chest ct - aorta - schedule today   Follow-Up: At [facility], you and your health needs are our priority.  As part of our continuing mission to provide you with exceptional heart care, we have created designated Provider Care Teams.  These Care Teams include your primary Cardiologist (physician) and Advanced Practice Providers (APPs -  Physician Assistants and Nurse Practitioners) who all work together to provide you with the care you need, when you need it.   Your next appointment:   12 month(s)  The format for your next appointment:   In Person  Provider:   Verne Carrow, MD

## 2021-07-07 NOTE — Progress Notes (Signed)
Chief Complaint  Patient presents with   Follow-up    CAD   History of Present Illness: 56 yo male with history of HTN, mild CAD, thoracic aortic aneursym and GERD here today for follow up. He was [facility] 2018 with chest pain and had negative cardiac markers. Cardiac cath on 11/11/16 with mild non-obstructive CAD (20% mid RCA stenosis, 20% OM1 stenosis). LVEF=55-65% by LV gram. Flow down the coronary arteries was sluggish suggesting possible microvascular disease. He was started on ASA, Norvasc and a statin. No beta blocker given bradycardia. CTA chest with mildly dilated ascending aorta. Echo May 2018 with normal LV function, LVEF=55-60%, mild LVH, grade 1 diastolic dysfunction, LAE. No significant valve disease. CTA chest June 2021 with stable 3.9 cm ascending aortic aneurysm.   He is here today for follow up. The patient denies any chest pain, dyspnea, palpitations, lower extremity edema, orthopnea, PND, dizziness, near syncope or syncope. He has been under stress lately at home. His 754 month old granddaughter has been having seizures.   Primary Care Physician: [name], Robert, MD  Past Medical History:  Diagnosis Date   Borderline hypertension    Chest pain, moderate coronary artery risk 04/24/2011   Family history of adverse reaction to anesthesia    "my father used to be slow to wake up from it"   GERD (gastroesophageal reflux disease)     Past Surgical History:  Procedure Laterality Date   COLONOSCOPY  06/2016   "diverticulitis"   LEFT HEART CATH AND CORONARY ANGIOGRAPHY N/A 11/11/2016   Procedure: Left Heart Cath and Coronary Angiography;  Surgeon: Kathleene HazelMcAlhany, Ilamae Geng D, MD;  [facility] CV LAB;  Service: Cardiovascular;  Laterality: N/A;    Current Outpatient Medications  Medication Sig Dispense Refill   amLODipine (NORVASC) 5 MG tablet Take 1 tablet (5 mg total) by mouth daily. 90 tablet 3   aspirin 81 MG chewable tablet Chew 1 tablet (81 mg total)  by mouth daily. 30 tablet 0   lisinopril (ZESTRIL) 10 MG tablet TAKE 1 TABLET BY MOUTH EVERY DAY 90 tablet 0   Multiple Vitamins-Minerals (ONE-A-DAY MENS 50+ PO) Take by mouth daily.     rosuvastatin (CRESTOR) 10 MG tablet TAKE 1 TABLET BY MOUTH EVERY DAY 90 tablet 0   tadalafil (CIALIS) 5 MG tablet Take 5 mg by mouth daily.     No current facility-administered medications for this visit.    Allergies  Allergen Reactions   Atorvastatin     Other reaction(s): leg pain (2019)   Penicillins Rash    Has patient had a PCN reaction causing immediate rash, facial/tongue/throat swelling, SOB or lightheadedness with hypotension: No Has patient had a PCN reaction causing severe rash involving mucus membranes or skin necrosis: No Has patient had a PCN reaction that required hospitalization: No Has patient had a PCN reaction occurring within the last 10 years: No If all of the above answers are "NO", then may proceed with Cephalosporin use.    Social History   Socioeconomic History   Marital status: Married    Spouse name: Not on file   Number of children: Not on file   Years of education: Not on file   Highest education level: Not on file  Occupational History   Occupation: Network engineerwner and CFO    Employer: [employer]  Tobacco Use   Smoking status: Never   Smokeless tobacco: Never  Vaping Use   Vaping Use: Never used  Substance and Sexual Activity  Alcohol use: Yes    Alcohol/week: 2.0 standard drinks    Types: 2 Cans of beer per week   Drug use: No   Sexual activity: Yes  Other Topics Concern   Not on file  Social History Narrative   Pt lives with wife near [place], [place].    Social Determinants of Health   Financial Resource Strain: Not on file  Food Insecurity: Not on file  Transportation Needs: Not on file  Physical Activity: Not on file  Stress: Not on file  Social Connections: Not on file  Intimate Partner Violence: Not on file    Family History  Problem  Relation Age of Onset   Coronary artery disease Maternal Grandfather        CABG in 70's   Sudden death Father 47       Farming accident (not cardiac)    Review of Systems:  As stated in the HPI and otherwise negative.   BP (!) 152/78    Pulse 63    Ht 5\' 9"  (1.753 m)    Wt 186 lb 12.8 oz (84.7 kg)    SpO2 97%    BMI 27.59 kg/m   Physical Examination: General: Well developed, well nourished, NAD  HEENT: OP clear, mucus membranes moist  SKIN: warm, dry. No rashes. Neuro: No focal deficits  Musculoskeletal: Muscle strength 5/5 all ext  Psychiatric: Mood and affect normal  Neck: No JVD, no carotid bruits, no thyromegaly, no lymphadenopathy.  Lungs:Clear bilaterally, no wheezes, rhonci, crackles Cardiovascular: Regular rate and rhythm. No murmurs, gallops or rubs. Abdomen:Soft. Bowel sounds present. Non-tender.  Extremities: No lower extremity edema. Pulses are 2 + in the bilateral DP/PT.  Echo 11/11/16: - Left ventricle: The cavity size was mildly dilated. Wall   thickness was increased in a pattern of mild LVH. Systolic   function was normal. The estimated ejection fraction was in the   range of 55% to 60%. Wall motion was normal; there were no   regional wall motion abnormalities. Doppler parameters are   consistent with abnormal left ventricular relaxation (grade 1   diastolic dysfunction). - Left atrium: The atrium was moderately dilated.  Cardiac cath 11/11/16: Left Circumflex  Vessel is large.  First Obtuse Marginal Branch  Vessel is large in size.  Ost 1st Mrg to 1st Mrg lesion, 20% stenosed.  Second Obtuse Marginal Branch  Vessel is large in size.  Third Obtuse Marginal Branch  Vessel is moderate in size.  Right Coronary Artery  Mid RCA lesion, 20% stenosed.  Wall Motion              Left Heart   Left Ventricle The left ventricular size is normal. The left ventricular systolic function is normal. LV end diastolic pressure is normal. The left ventricular  ejection fraction is 55-65% by visual estimate. No regional wall motion abnormalities. There is no evidence of mitral regurgitation.    Coronary Diagrams   Diagnostic Diagram         CTA chest may 2018: 1. 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 Recommend annual imaging followup by CTA or MRA. This recommendation follows 2010 ACCF/AHA/AATS/ACR/ASA/SCA/SCAI/SIR/STS/SVM Guidelines for the Diagnosis and Management of Patients with Thoracic Aortic Disease. Circulation. 2010; 121: 2011 2. No evidence of aortic dissection. No evidence of significant pulmonary embolus. No significant calcific atherosclerotic disease seen. 3. Mild bibasilar atelectasis noted.  Lungs otherwise clear.  EKG:  EKG is  ordered today. The ekg  ordered today demonstrates sinus  Recent Labs: No results found for requested labs within last 8760 hours.   Lipid Panel    Component Value Date/Time   CHOL 125 06/05/2018 0823   TRIG 42 06/05/2018 0823   HDL 60 06/05/2018 0823   CHOLHDL 2.1 06/05/2018 0823   CHOLHDL 2.9 11/11/2016 0513   VLDL 12 11/11/2016 0513   LDLCALC 57 06/05/2018 0823     Wt Readings from Last 3 Encounters:  07/07/21 186 lb 12.8 oz (84.7 kg)  04/27/20 187 lb 6.4 oz (85 kg)  04/29/19 182 lb 12.8 oz (82.9 kg)     Other studies Reviewed: Additional studies/ records that were reviewed today include: . Review of the above records demonstrates:   Assessment and Plan:   1. CAD without angina: Mild CAD by cath in 2018. Possible microvascular component to his chest pain. No chest pain. Continue ASA, statin and Norvasc.     2. Thoracic aortic aneurysm: Mild dilation aortic root unchanged at 3.9 cm by chest CTA June 2021. Repeat chest CTA now.  BMET today  3. HTN: BP is elevated today. He will follow at home over the next few weeks and call back if it remains elevated. Continue current meds.   4. Hyperlipidemia: Lipids followed in  primary care. LDL 64 in February 2022. Continue statin   Current medicines are reviewed at length with the patient today.  The patient does not have concerns regarding medicines.  The following changes have been made:  no change  Labs/ tests ordered today include:   Orders Placed This Encounter  Procedures   CT ANGIO CHEST AORTA W/CM & OR WO/CM   Basic Metabolic Panel (BMET)   EKG 12-Lead     Disposition:   FU with me in 12 months   Signed, Verne Carrow, MD 07/07/2021 4:25 PM    [facility] [address] [place], [place], [place]  [address] Phone: [phone]; Fax: [phone]

## 2021-07-08 ENCOUNTER — Other Ambulatory Visit: Payer: Self-pay | Admitting: Cardiovascular Disease

## 2021-07-08 LAB — BASIC METABOLIC PANEL
BUN/Creatinine Ratio: 13 (ref 9–20)
BUN: 13 mg/dL (ref 6–24)
CO2: 27 mmol/L (ref 20–29)
Calcium: 9.6 mg/dL (ref 8.7–10.2)
Chloride: 103 mmol/L (ref 96–106)
Creatinine, Ser: 1.01 mg/dL (ref 0.76–1.27)
Glucose: 88 mg/dL (ref 70–99)
Potassium: 3.8 mmol/L (ref 3.5–5.2)
Sodium: 143 mmol/L (ref 134–144)
eGFR: 87 mL/min/{1.73_m2} (ref >59)

## 2021-07-09 MED ORDER — ROSUVASTATIN CALCIUM 10 MG PO TABS: 10.0000 mg | ORAL_TABLET | Freq: Every day | ORAL | 3 refills | Status: DC

## 2021-07-09 MED ORDER — LISINOPRIL 10 MG PO TABS: 10.0000 mg | ORAL_TABLET | Freq: Every day | ORAL | 3 refills | Status: DC

## 2021-07-09 NOTE — Addendum Note (Signed)
Addended by: Lendon Ka on: 07/09/2021 01:54 PM   Modules accepted: Orders

## 2021-07-21 ENCOUNTER — Ambulatory Visit (INDEPENDENT_AMBULATORY_CARE_PROVIDER_SITE_OTHER)
Admission: RE | Admit: 2021-07-21 | Discharge: 2021-07-21 | Disposition: A | Discharge status: Home / Self Care | Payer: No Typology Code available for payment source | Source: Ambulatory Visit | Attending: Cardiovascular Disease | Admitting: Cardiovascular Disease

## 2021-07-21 ENCOUNTER — Other Ambulatory Visit: Payer: Self-pay

## 2021-07-21 DIAGNOSIS — I2510 Atherosclerotic heart disease of native coronary artery without angina pectoris: Secondary | ICD-10-CM

## 2021-07-21 DIAGNOSIS — I712 Thoracic aortic aneurysm, without rupture, unspecified: Secondary | ICD-10-CM

## 2021-07-21 DIAGNOSIS — I10 Essential (primary) hypertension: Secondary | ICD-10-CM | POA: Diagnosis not present

## 2021-07-21 DIAGNOSIS — E7800 Pure hypercholesterolemia, unspecified: Secondary | ICD-10-CM

## 2021-07-21 MED ORDER — IOHEXOL 350 MG/ML SOLN
100.0000 mL | Freq: Once | INTRAVENOUS | Status: AC | PRN
  Administered 2021-07-21: 100 mL via INTRAVENOUS

## 2022-05-19 ENCOUNTER — Other Ambulatory Visit: Payer: Self-pay | Admitting: Cardiovascular Disease

## 2022-07-25 ENCOUNTER — Encounter: Payer: Self-pay | Admitting: Cardiovascular Disease

## 2022-07-25 ENCOUNTER — Ambulatory Visit
Payer: No Typology Code available for payment source | Attending: Cardiovascular Disease | Admitting: Cardiovascular Disease

## 2022-07-25 VITALS — BP 126/82 | HR 56 | Ht 69.0 in | Wt 181.0 lb

## 2022-07-25 DIAGNOSIS — I712 Thoracic aortic aneurysm, without rupture, unspecified: Secondary | ICD-10-CM

## 2022-07-25 DIAGNOSIS — I2510 Atherosclerotic heart disease of native coronary artery without angina pectoris: Secondary | ICD-10-CM

## 2022-07-25 DIAGNOSIS — E7800 Pure hypercholesterolemia, unspecified: Secondary | ICD-10-CM | POA: Diagnosis not present

## 2022-07-25 DIAGNOSIS — I10 Essential (primary) hypertension: Secondary | ICD-10-CM

## 2022-07-25 NOTE — Progress Notes (Signed)
Chief Complaint  Patient presents with   Follow-up    CAD, thoracic aortic aneurysm   History of Present Illness: 57 yo male with history of HTN, mild CAD, thoracic aortic aneursym and GERD here today for follow up. He was admitted to [facility] May 2018 with chest pain and had negative cardiac markers. Cardiac cath on 11/11/16 with mild non-obstructive CAD (20% mid RCA stenosis, 20% OM1 stenosis). LVEF=55-65% by LV gram. Flow down the coronary arteries was sluggish suggesting possible microvascular disease. He was started on ASA, Norvasc and a statin. No beta blocker given bradycardia. CTA chest with mildly dilated ascending aorta. Echo May 2018 with normal LV function, LVEF=55-60%, mild LVH, grade 1 diastolic dysfunction, LAE. No significant valve disease. CTA chest February 2023 with 4.0 cm ascending aortic aneurysm.   He is here today for follow up. The patient denies any chest pain, dyspnea, palpitations, lower extremity edema, orthopnea, PND, dizziness, near syncope or syncope.   Primary Care Physician: Gaynelle Arabian, MD  Past Medical History:  Diagnosis Date   Borderline hypertension    Chest pain, moderate coronary artery risk 04/24/2011   Family history of adverse reaction to anesthesia    "my father used to be slow to wake up from it"   GERD (gastroesophageal reflux disease)     Past Surgical History:  Procedure Laterality Date   COLONOSCOPY  06/2016   "diverticulitis"   LEFT HEART CATH AND CORONARY ANGIOGRAPHY N/A 11/11/2016   Procedure: Left Heart Cath and Coronary Angiography;  Surgeon: Burnell Blanks, MD;  Location: [place] CV LAB;  Service: Cardiovascular;  Laterality: N/A;    Current Outpatient Medications  Medication Sig Dispense Refill   amLODipine (NORVASC) 5 MG tablet Take 1 tablet (5 mg total) by mouth daily. 90 tablet 0   aspirin 81 MG chewable tablet Chew 1 tablet (81 mg total) by mouth daily. 30 tablet 0   lisinopril (ZESTRIL) 10 MG tablet Take 1  tablet (10 mg total) by mouth daily. 90 tablet 3   Multiple Vitamins-Minerals (ONE-A-DAY MENS 50+ PO) Take by mouth daily.     rosuvastatin (CRESTOR) 10 MG tablet Take 1 tablet (10 mg total) by mouth daily. 90 tablet 3   tadalafil (CIALIS) 5 MG tablet Take 5 mg by mouth daily. (Patient not taking: Reported on 07/25/2022)     No current facility-administered medications for this visit.    Allergies  Allergen Reactions   Atorvastatin     Other reaction(s): leg pain (2019)   Penicillins Rash    Has patient had a PCN reaction causing immediate rash, facial/tongue/throat swelling, SOB or lightheadedness with hypotension: No Has patient had a PCN reaction causing severe rash involving mucus membranes or skin necrosis: No Has patient had a PCN reaction that required hospitalization: No Has patient had a PCN reaction occurring within the last 10 years: No If all of the above answers are "NO", then may proceed with Cephalosporin use.    Social History   Socioeconomic History   Marital status: Married    Spouse name: Not on file   Number of children: Not on file   Years of education: Not on file   Highest education level: Not on file  Occupational History   Occupation: Financial controller and CFO    Employer: [employer]  Tobacco Use   Smoking status: Never   Smokeless tobacco: Never  Vaping Use   Vaping Use: Never used  Substance and Sexual Activity   Alcohol use: Yes  Alcohol/week: 2.0 standard drinks of alcohol    Types: 2 Cans of beer per week   Drug use: No   Sexual activity: Yes  Other Topics Concern   Not on file  Social History Narrative   Pt lives with wife near [place], [place].    Social Determinants of Health   Financial Resource Strain: Not on file  Food Insecurity: Not on file  Transportation Needs: Not on file  Physical Activity: Not on file  Stress: Not on file  Social Connections: Not on file  Intimate Partner Violence: Not on file    Family History  Problem  Relation Age of Onset   Coronary artery disease Maternal Grandfather        CABG in 39's   Sudden death Father 36       Farming accident (not cardiac)    Review of Systems:  As stated in the HPI and otherwise negative.   BP 126/82   Pulse (!) 56   Ht 5\' 9"  (1.753 m)   Wt 82.1 kg   SpO2 98%   BMI 26.73 kg/m   Physical Examination: General: Well developed, well nourished, NAD  HEENT: OP clear, mucus membranes moist  SKIN: warm, dry. No rashes. Neuro: No focal deficits  Musculoskeletal: Muscle strength 5/5 all ext  Psychiatric: Mood and affect normal  Neck: No JVD, no carotid bruits, no thyromegaly, no lymphadenopathy.  Lungs:Clear bilaterally, no wheezes, rhonci, crackles Cardiovascular: Regular rate and rhythm. No murmurs, gallops or rubs. Abdomen:Soft. Bowel sounds present. Non-tender.  Extremities: No lower extremity edema. Pulses are 2 + in the bilateral DP/PT.  Echo 11/11/16: - Left ventricle: The cavity size was mildly dilated. Wall   thickness was increased in a pattern of mild LVH. Systolic   function was normal. The estimated ejection fraction was in the   range of 55% to 60%. Wall motion was normal; there were no   regional wall motion abnormalities. Doppler parameters are   consistent with abnormal left ventricular relaxation (grade 1   diastolic dysfunction). - Left atrium: The atrium was moderately dilated.  Cardiac cath 11/11/16: Left Circumflex  Vessel is large.  First Obtuse Marginal Branch  Vessel is large in size.  Ost 1st Mrg to 1st Mrg lesion, 20% stenosed.  Second Obtuse Marginal Branch  Vessel is large in size.  Third Obtuse Marginal Branch  Vessel is moderate in size.  Right Coronary Artery  Mid RCA lesion, 20% stenosed.  Wall Motion              Left Heart   Left Ventricle The left ventricular size is normal. The left ventricular systolic function is normal. LV end diastolic pressure is normal. The left ventricular ejection fraction is  55-65% by visual estimate. No regional wall motion abnormalities. There is no evidence of mitral regurgitation.    Coronary Diagrams   Diagnostic Diagram         EKG:  EKG is ordered today. The ekg ordered today demonstrates Sinus brady, rate 56 bpm.   Recent Labs: No results found for requested labs within last 365 days.   Lipid Panel    Component Value Date/Time   CHOL 125 06/05/2018 0823   TRIG 42 06/05/2018 0823   HDL 60 06/05/2018 0823   CHOLHDL 2.1 06/05/2018 0823   CHOLHDL 2.9 11/11/2016 0513   VLDL 12 11/11/2016 0513   LDLCALC 57 06/05/2018 0823     Wt Readings from Last 3 Encounters:  07/25/22 82.1 kg  07/07/21  84.7 kg  04/27/20 85 kg    Assessment and Plan:   1. CAD without angina: Mild CAD by cath in 2018. Possible microvascular component to his chest pain. No chest pain suggestive of angina. Will continue ASA, Norvasc and statin.      2. Thoracic aortic aneurysm: Mild dilation aortic root unchanged at 4.0 by chest CTA February 2023. Repeat chest CTA now.   3. HTN: BP is well controlled. No changes   4. Hyperlipidemia: Lipids followed in primary care. Continue statin.   Labs/ tests ordered today include:   Orders Placed This Encounter  Procedures   CT ANGIO CHEST AORTA W/CM & OR WO/CM   EKG 12-Lead   Disposition:   FU with me in 12 months  Signed, Lauree Chandler, MD 07/25/2022 11:46 AM    [facility], [place], [place]  [address] Phone: [phone]; Fax: [phone]

## 2022-07-25 NOTE — Patient Instructions (Signed)
Medication Instructions:  No changes *If you need a refill on your cardiac medications before your next appointment, please call your pharmacy*   Lab Work: None  If you have labs (blood work) drawn today and your tests are completely normal, you will receive your results only by: [place] (if you have MyChart) OR A paper copy in the mail If you have any lab test that is abnormal or we need to change your treatment, we will call you to review the results.   Testing/Procedures: Chest CTA Aorta -schedule for March 2024.    Follow-Up: At [facility], you and your health needs are our priority.  As part of our continuing mission to provide you with exceptional heart care, we have created designated Provider Care Teams.  These Care Teams include your primary Cardiologist (physician) and Advanced Practice Providers (APPs -  Physician Assistants and Nurse Practitioners) who all work together to provide you with the care you need, when you need it.   Your next appointment:   12 month(s)  Provider:   Lauree Chandler, MD

## 2022-08-19 ENCOUNTER — Other Ambulatory Visit: Payer: Self-pay | Admitting: Cardiovascular Disease

## 2022-08-24 ENCOUNTER — Ambulatory Visit (HOSPITAL_COMMUNITY)
Admission: RE | Admit: 2022-08-24 | Discharge: 2022-08-24 | Disposition: A | Discharge status: Home / Self Care | Payer: No Typology Code available for payment source | Source: Ambulatory Visit | Attending: Cardiovascular Disease | Admitting: Cardiovascular Disease

## 2022-08-24 DIAGNOSIS — I712 Thoracic aortic aneurysm, without rupture, unspecified: Secondary | ICD-10-CM | POA: Diagnosis not present

## 2022-08-24 MED ORDER — IOHEXOL 350 MG/ML SOLN
100.0000 mL | Freq: Once | INTRAVENOUS | Status: AC | PRN
  Administered 2022-08-24: 100 mL via INTRAVENOUS

## 2023-02-02 ENCOUNTER — Ambulatory Visit: Payer: No Typology Code available for payment source | Admitting: Cardiovascular Disease

## 2023-08-08 ENCOUNTER — Other Ambulatory Visit: Payer: Self-pay | Admitting: Cardiovascular Disease

## 2023-08-16 ENCOUNTER — Encounter: Payer: Self-pay | Admitting: Cardiology

## 2023-08-16 ENCOUNTER — Other Ambulatory Visit: Payer: Self-pay | Admitting: Cardiology

## 2023-08-16 ENCOUNTER — Ambulatory Visit: Payer: No Typology Code available for payment source | Attending: Cardiology | Admitting: Cardiology

## 2023-08-16 VITALS — BP 120/82 | HR 52 | Ht 69.0 in | Wt 187.6 lb

## 2023-08-16 DIAGNOSIS — I712 Thoracic aortic aneurysm, without rupture, unspecified: Secondary | ICD-10-CM

## 2023-08-16 DIAGNOSIS — I10 Essential (primary) hypertension: Secondary | ICD-10-CM

## 2023-08-16 DIAGNOSIS — I2510 Atherosclerotic heart disease of native coronary artery without angina pectoris: Secondary | ICD-10-CM

## 2023-08-16 DIAGNOSIS — E7800 Pure hypercholesterolemia, unspecified: Secondary | ICD-10-CM | POA: Diagnosis not present

## 2023-08-16 MED ORDER — AMLODIPINE BESYLATE 5 MG PO TABS: 5.0000 mg | ORAL_TABLET | Freq: Every day | ORAL | 3 refills | Status: DC

## 2023-08-16 MED ORDER — LISINOPRIL 10 MG PO TABS: 10.0000 mg | ORAL_TABLET | Freq: Every day | ORAL | 3 refills | Status: DC

## 2023-08-16 MED ORDER — ROSUVASTATIN CALCIUM 10 MG PO TABS: 10.0000 mg | ORAL_TABLET | Freq: Every day | ORAL | 3 refills | Status: DC

## 2023-08-16 NOTE — Progress Notes (Signed)
 Cardiology Office Note:    Date:  08/16/2023   ID:  Zachary Raymond, DOB 04/28/1966, MRN [id number]  PCP:  [facility]    HeartCare Providers Cardiologist:  Zachary Carrow, MD     Referring MD: No ref. provider found   Chief Complaint  Patient presents with   Follow-up   History of Present Illness:    Zachary Raymond is a 58 y.o. male with a hx of HTN, mild non-obstructive CAD, thoracic aneurysm, and GERD who is here for annual follow up, seen for Dr. Clifton James.   Mr. Folson underwent LHC in 2018 after presenting to [facility] with chest pain. Cath showed mild non-obstructive CAD (20% mid RCA stenosis, 20% OM1 stenosis). LVEF=55-65% by LV gram. Flow down the coronary arteries was sluggish suggesting possible microvascular disease. He was started on ASA, Norvasc and a statin. No beta blocker given bradycardia. CTA chest with mildly dilated ascending aorta. Echo 10/2016 with normal LV function, LVEF=55-60%, mild LVH, grade 1 diastolic dysfunction, LAE. No significant valve disease. 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   He is here today alone and reports he has been very well since last seen. He has no chest pain, SOB, palpitations, LE edema, orthopnea, PND, dizziness, or syncope. Denies bleeding in stool or urine. Saw his PCP yesterday for regular follow up with full labs which look great.    Past Medical History:  Diagnosis Date   Borderline hypertension    Chest pain, moderate coronary artery risk 04/24/2011   Family history of adverse reaction to anesthesia    "my father used to be slow to wake up from it"   GERD (gastroesophageal reflux disease)    Past Surgical History:  Procedure Laterality Date   COLONOSCOPY  06/2016   "diverticulitis"   LEFT HEART CATH AND CORONARY ANGIOGRAPHY N/A 11/11/2016   Procedure: Left Heart Cath and Coronary Angiography;  Surgeon: Zachary Hazel, MD;  Location: [facility] INVASIVE CV LAB;  Service:  Cardiovascular;  Laterality: N/A;   Current Medications: Current Meds  Medication Sig   aspirin 81 MG chewable tablet Chew 1 tablet (81 mg total) by mouth daily.   Multiple Vitamins-Minerals (ONE-A-DAY MENS 50+ PO) Take by mouth daily.   [DISCONTINUED] amLODipine (NORVASC) 5 MG tablet TAKE 1 TABLET (5 MG TOTAL) BY MOUTH DAILY.   [DISCONTINUED] lisinopril (ZESTRIL) 10 MG tablet TAKE 1 TABLET BY MOUTH EVERY DAY   [DISCONTINUED] rosuvastatin (CRESTOR) 10 MG tablet TAKE 1 TABLET BY MOUTH EVERY DAY    Allergies:   Atorvastatin and Penicillins   Social History   Socioeconomic History   Marital status: Married    Spouse name: Not on file   Number of children: Not on file   Years of education: Not on file   Highest education level: Not on file  Occupational History   Occupation: Network engineer and CFO    Employer: [employer]  Tobacco Use   Smoking status: Never   Smokeless tobacco: Never  Vaping Use   Vaping status: Never Used  Substance and Sexual Activity   Alcohol use: Yes    Alcohol/week: 2.0 standard drinks of alcohol    Types: 2 Cans of beer per week   Drug use: No   Sexual activity: Yes  Other Topics Concern   Not on file  Social History Narrative   Pt lives with wife near [place], [place].    Social Drivers of Corporate investment banker Strain: Not on file  Food Insecurity: Not on file  Transportation Needs: Not on file  Physical Activity: Not on file  Stress: Not on file  Social Connections: Not on file     Family History: The patient's family history includes Coronary artery disease in his maternal grandfather; Sudden death (age of onset: 46) in his father.  ROS:   Please see the history of present illness.     All other systems reviewed and are negative.  EKGs/Labs/Other Studies Reviewed:    The following studies were reviewed today:  EKG Interpretation Date/Time:  Wednesday August 16 2023 08:29:07 EST Ventricular Rate:  52 PR Interval:  170 QRS  Duration:  104 QT Interval:  460 QTC Calculation: 427 R Axis:   -5  Text Interpretation: Sinus bradycardia When compared with ECG of 11-Nov-2016 09:42, Incomplete right bundle branch block is no longer Present Confirmed by Georgie Chard ([id number]) on 08/16/2023 8:49:17 AM    Recent Labs: No results found for requested labs within last 365 days.  Recent Lipid Panel    Component Value Date/Time   CHOL 125 06/05/2018 0823   TRIG 42 06/05/2018 0823   HDL 60 06/05/2018 0823   CHOLHDL 2.1 06/05/2018 0823   CHOLHDL 2.9 11/11/2016 0513   VLDL 12 11/11/2016 0513   LDLCALC 57 06/05/2018 0823   Physical Exam:    VS:  BP 120/82   Pulse (!) 52   Ht 5\' 9"  (1.753 m)   Wt 187 lb 9.6 oz (85.1 kg)   SpO2 95%   BMI 27.70 kg/m     Wt Readings from Last 3 Encounters:  08/16/23 187 lb 9.6 oz (85.1 kg)  07/25/22 181 lb (82.1 kg)  07/07/21 186 lb 12.8 oz (84.7 kg)    General: Well developed, well nourished, NAD Lungs:Clear to ausculation bilaterally. No wheezes, rales, or rhonchi. Breathing is unlabored. Cardiovascular: RRR with S1 S2. No murmurs Extremities: No edema.  Neuro: Alert and oriented. No focal deficits. No facial asymmetry. MAE spontaneously. Psych: Responds to questions appropriately with normal affect.    ASSESSMENT/PLAN:    Mild non-obstructive CAD: By cath from 2018 with no recurrent anginal symptoms. Continue ASA, statin. No BB given baseline bradycardia.   TAA: Annual chest CTA with very stable measurement at 4.0cm. Obtain repeat CTA. BMET per PCP with normal CR.   HTN: Well controlled today. Continue Amlodipine 5mg  daily, lisinopril 10mg  daily.   HLD: Lipid panel yesterday with PCP with normal LDL, HDL, total CHO. Continue Crestor 10mg  daily.     Medication Adjustments/Labs and Tests Ordered: Current medicines are reviewed at length with the patient today.  Concerns regarding medicines are outlined above.  Orders Placed This Encounter  Procedures   CT ANGIO CHEST  AORTA W/CM & OR WO/CM   EKG 12-Lead   Meds ordered this encounter  Medications   amLODipine (NORVASC) 5 MG tablet    Sig: Take 1 tablet (5 mg total) by mouth daily.    Dispense:  90 tablet    Refill:  3   lisinopril (ZESTRIL) 10 MG tablet    Sig: Take 1 tablet (10 mg total) by mouth daily.    Dispense:  90 tablet    Refill:  3   rosuvastatin (CRESTOR) 10 MG tablet    Sig: Take 1 tablet (10 mg total) by mouth daily.    Dispense:  90 tablet    Refill:  3    Patient Instructions  Medication Instructions:  Your physician recommends that you continue on your current  medications as directed. Please refer to the Current Medication list given to you today.  *If you need a refill on your cardiac medications before your next appointment, please call your pharmacy*   Lab Work: None ordered  If you have labs (blood work) drawn today and your tests are completely normal, you will receive your results only by: MyChart Message (if you have MyChart) OR A paper copy in the mail If you have any lab test that is abnormal or we need to change your treatment, we will call you to review the results.   Testing/Procedures: Non-Cardiac CT Angiography (CTA), is a special type of CT scan that uses a computer to produce multi-dimensional views of major blood vessels throughout the body. In CT angiography, a contrast material is injected through an IV to help visualize the blood vessels    Follow-Up: At [facility], you and your health needs are our priority.  As part of our continuing mission to provide you with exceptional heart care, we have created designated Provider Care Teams.  These Care Teams include your primary Cardiologist (physician) and Advanced Practice Providers (APPs -  Physician Assistants and Nurse Practitioners) who all work together to provide you with the care you need, when you need it.  We recommend signing up for the patient portal called "MyChart".  Sign up  information is provided on this After Visit Summary.  MyChart is used to connect with patients for Virtual Visits (Telemedicine).  Patients are able to view lab/test results, encounter notes, upcoming appointments, etc.  Non-urgent messages can be sent to your provider as well.   To learn more about what you can do with MyChart, go to [email or web address].    Your next appointment:   12 month(s)  Provider:   Verne Carrow, MD     Other Instructions     1st Floor: - Lobby - Registration  - Pharmacy  - Lab - Cafe  2nd Floor: - PV Lab - Diagnostic Testing (echo, CT, nuclear med)  3rd Floor: - Vacant  4th Floor: - TCTS (cardiothoracic surgery) - AFib Clinic - Structural Heart Clinic - Vascular Surgery  - Vascular Ultrasound  5th Floor: - HeartCare Cardiology (general and EP) - Clinical Pharmacy for coumadin, hypertension, lipid, weight-loss medications, and med management appointments    Valet parking services will be available as well.         Signed, Georgie Chard, NP  08/16/2023 8:49 AM    [facility]

## 2023-08-16 NOTE — Patient Instructions (Signed)
 Medication Instructions:  Your physician recommends that you continue on your current medications as directed. Please refer to the Current Medication list given to you today.  *If you need a refill on your cardiac medications before your next appointment, please call your pharmacy*   Lab Work: None ordered  If you have labs (blood work) drawn today and your tests are completely normal, you will receive your results only by: MyChart Message (if you have MyChart) OR A paper copy in the mail If you have any lab test that is abnormal or we need to change your treatment, we will call you to review the results.   Testing/Procedures: Non-Cardiac CT Angiography (CTA), is a special type of CT scan that uses a computer to produce multi-dimensional views of major blood vessels throughout the body. In CT angiography, a contrast material is injected through an IV to help visualize the blood vessels    Follow-Up: At [facility], you and your health needs are our priority.  As part of our continuing mission to provide you with exceptional heart care, we have created designated Provider Care Teams.  These Care Teams include your primary Cardiologist (physician) and Advanced Practice Providers (APPs -  Physician Assistants and Nurse Practitioners) who all work together to provide you with the care you need, when you need it.  We recommend signing up for the patient portal called "MyChart".  Sign up information is provided on this After Visit Summary.  MyChart is used to connect with patients for Virtual Visits (Telemedicine).  Patients are able to view lab/test results, encounter notes, upcoming appointments, etc.  Non-urgent messages can be sent to your provider as well.   To learn more about what you can do with MyChart, go to [email or web address].    Your next appointment:   12 month(s)  Provider:   Verne Carrow, MD     Other Instructions     1st Floor: - Lobby -  Registration  - Pharmacy  - Lab - Cafe  2nd Floor: - PV Lab - Diagnostic Testing (echo, CT, nuclear med)  3rd Floor: - Vacant  4th Floor: - TCTS (cardiothoracic surgery) - AFib Clinic - Structural Heart Clinic - Vascular Surgery  - Vascular Ultrasound  5th Floor: - HeartCare Cardiology (general and EP) - Clinical Pharmacy for coumadin, hypertension, lipid, weight-loss medications, and med management appointments    Valet parking services will be available as well.

## 2023-08-17 ENCOUNTER — Other Ambulatory Visit: Payer: Self-pay | Admitting: Cardiology

## 2023-09-19 ENCOUNTER — Encounter: Payer: Self-pay | Admitting: Cardiovascular Disease

## 2023-11-10 ENCOUNTER — Other Ambulatory Visit: Payer: Self-pay | Admitting: Cardiovascular Disease

## 2023-12-04 ENCOUNTER — Other Ambulatory Visit: Payer: Self-pay | Admitting: Cardiovascular Disease
